# Patient Record
Sex: Female | Born: 1985 | Race: Black or African American | Hispanic: No | Marital: Single | Smoking: Never smoker
Health system: Southern US, Community
[De-identification: ages and names within clinical notes are randomized; demographics above are authoritative.]

## PROBLEM LIST (undated history)

## (undated) ENCOUNTER — Inpatient Hospital Stay (HOSPITAL_COMMUNITY): Payer: Self-pay

## (undated) ENCOUNTER — Inpatient Hospital Stay (HOSPITAL_COMMUNITY): Payer: 59

## (undated) DIAGNOSIS — R338 Other retention of urine: Secondary | ICD-10-CM

## (undated) DIAGNOSIS — D649 Anemia, unspecified: Secondary | ICD-10-CM

## (undated) DIAGNOSIS — Z9289 Personal history of other medical treatment: Secondary | ICD-10-CM

## (undated) DIAGNOSIS — O9903 Anemia complicating the puerperium: Secondary | ICD-10-CM

## (undated) HISTORY — PX: THERAPEUTIC ABORTION: SHX798

---
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
## 2007-09-29 ENCOUNTER — Emergency Department (HOSPITAL_COMMUNITY): Admission: EM | Admit: 2007-09-29 | Discharge: 2007-09-29 | Payer: Self-pay | Admitting: Emergency Medicine

## 2007-10-15 HISTORY — PX: LEEP: SHX91

## 2007-11-09 ENCOUNTER — Emergency Department (HOSPITAL_COMMUNITY): Admission: EM | Admit: 2007-11-09 | Discharge: 2007-11-10 | Payer: Self-pay | Admitting: *Deleted

## 2008-02-23 ENCOUNTER — Emergency Department (HOSPITAL_COMMUNITY): Admission: EM | Admit: 2008-02-23 | Discharge: 2008-02-23 | Payer: Self-pay | Admitting: Emergency Medicine

## 2008-08-04 ENCOUNTER — Encounter (INDEPENDENT_AMBULATORY_CARE_PROVIDER_SITE_OTHER): Payer: Self-pay | Admitting: Obstetrics and Gynecology

## 2008-08-04 ENCOUNTER — Ambulatory Visit (HOSPITAL_COMMUNITY): Admission: RE | Admit: 2008-08-04 | Discharge: 2008-08-04 | Payer: Self-pay | Admitting: Obstetrics and Gynecology

## 2009-07-18 ENCOUNTER — Emergency Department (HOSPITAL_COMMUNITY): Admission: EM | Admit: 2009-07-18 | Discharge: 2009-07-18 | Payer: Self-pay | Admitting: Family Medicine

## 2011-01-17 LAB — URINE CULTURE: Colony Count: 50000

## 2011-01-17 LAB — POCT URINALYSIS DIP (DEVICE)
Bilirubin Urine: NEGATIVE
Glucose, UA: NEGATIVE mg/dL
Hgb urine dipstick: NEGATIVE
Ketones, ur: NEGATIVE mg/dL
Nitrite: NEGATIVE
Protein, ur: NEGATIVE mg/dL
Specific Gravity, Urine: 1.02 (ref 1.005–1.030)
Urobilinogen, UA: 0.2 mg/dL (ref 0.0–1.0)
pH: 5 (ref 5.0–8.0)

## 2011-01-17 LAB — WET PREP, GENITAL: Trich, Wet Prep: NONE SEEN

## 2011-01-17 LAB — POCT PREGNANCY, URINE: Preg Test, Ur: NEGATIVE

## 2011-01-17 LAB — GC/CHLAMYDIA PROBE AMP, GENITAL
Chlamydia, DNA Probe: NEGATIVE
GC Probe Amp, Genital: NEGATIVE

## 2011-02-26 NOTE — Op Note (Signed)
Ashley Shaw, Ashley Shaw              ACCOUNT NO.:  [id number]   MEDICAL RECORD NO.:  [id number]          PATIENT TYPE:  AMB   LOCATION:  SDC                           FACILITY:  [facility]   PHYSICIAN:  Osborn Coho, M.D.   DATE OF BIRTH:  02-07-1986   DATE OF PROCEDURE:  08/04/2008  DATE OF DISCHARGE:                               OPERATIVE REPORT   PREOPERATIVE DIAGNOSIS:  Cervical intraepithelial neoplasia I and II.   POSTOPERATIVE DIAGNOSIS:  Cervical intraepithelial neoplasia I and II.   PROCEDURE:  Loop electrocautery excision procedure.   ATTENDING DOCTOR:  Osborn Coho, MD.   ANESTHESIA:  MAC.   SPECIMENS TO PATHOLOGY:  Portion of cervix with stitch at 12 o'clock and  loose piece at 6 o'clock.   FLUIDS:  500 mL.   URINE OUTPUT:  Quantity sufficient via straight cath prior to procedure.   ESTIMATED BLOOD LOSS:  Minimal.   COMPLICATIONS:  None.   DESCRIPTION OF PROCEDURE:  The patient was taken to the operating room  after the risks, benefits, and alternatives discussed with the patient.  The patient verbalized understanding and consent signed and witnessed.  The patient was given a MAC per anesthesia and prepped and draped in the  normal sterile fashion in the dorsal lithotomy position.  A bivalve  speculum was placed in the patient's vagina and the anterior lip of the  cervix was grasped with single-tooth tenaculum.  A paracervical block  was administered using a total of 10 mL of 1% lidocaine.  Acetic acid  was used to prep the cervix and colposcopy was performed.  1. Colposcopy.  2. LEEP.  Colposcopy was performed and with the appearance of abnormal vasculature  from the 3 to 9 o'clock position and acetowhite lesion at 12 o'clock.  The LEEP specimen was performed using a loop that measured 10 mm x 10  mm.  The bed of the cervix was cauterized with the ball cautery tip on  the Bovie.  Monsel was applied to the bed of the  cervix as well.  There was good hemostasis  noted.  The tenaculum was  removed and there was good hemostasis at tenaculum sites.  Count was  correct.  The patient tolerated the procedure well and was currently  awaiting transfer to the recovery room in good condition.      Osborn Coho, M.D.  Electronically Signed     AR/MEDQ  D:  08/04/2008  T:  08/04/2008  Job:  [id number]

## 2011-03-01 NOTE — Consult Note (Signed)
[place]. [facility]  Patient:    Ashley Shaw, Ashley Shaw Visit Number: [id number] MRN: [id number]          Service Type: PED Location: PEDS [phone] Attending Physician:  Pablo Ledger T Dictated by:   Ruthann Cancer, M.D. Proc. Date: 08/04/01 Admit Date:  08/04/2001   CC:         Hinton Rao, M.D.; fax [phone], phone [phone]   Consultation Report  PRIMARY CARE PHYSICIAN:  Hinton Rao, M.D. of [facility]  FINAL DIAGNOSES: 1. Anemia of unknown etiology (iron deficiency versus anemia of chronic    disease versus occult blood loss). 2. Abdominal pain of unknown etiology.  PROCEDURE: 1. Abdominal CT with contrast on August 06, 2001 showed no abnormalities, no    evidence of inflammation, and no lesion. 2. Chest x-ray on August 05, 2001 showed no infiltrates or hilar adenopathy. 3. Abdominal KUB on August 04, 2001:  No evidence of obstruction, otherwise    within normal limits.  CHIEF COMPLAINT:  Vertigo, blurred vision, anemia.  HISTORY OF PRESENT ILLNESS:  This is a 25 year old African-American female who was in her usual state of health until one month prior to admission when she had vertigo walking up steps to school.  She also had decreased hearing bilaterally at that time.  She went to class and put her head down on the desk and symptoms resolved in a few minutes.  Her next episode was a similar episode one week later when she was standing up from a sitting position.  Her symptoms recurred one week ago on rising from bed.  She went to her primary doctor last Monday and was given a diagnosis of sinusitis and sent home on antibiotics plus Antivert as needed.  A Pred-pack prescription was given, but not filled.  The patient felt better for a week, but then on October 21 she awoke with a headache.  On the 22nd she again had vertigo when going up the school steps and the school nurse measured her temperature to be 100.7.   At her primary doctor today her CBC showed a hemoglobin of 6.3 and hematocrit of 17.2.  She was given a dose of Ceftriaxone IM x 1, Phenergan IM x 1, and triamcinolone IM x 1.  She was then sent to [facility].  The patient syncopized on the way up to the floor from the emergency department.  The patient has menstrual bleeding approximately five days per month and goes through approximately three pads or tampons per day.  Her last menstrual period was July 10, 2001.  She had menarche on April 2001.  She has no change in urine color or stool color or consistency.  No bright red blood per rectum or melena.  PAST MEDICAL HISTORY:  Positive only for an admission at age 62 for three to four days for "fluid in her knee."  MEDICATIONS:  Only as above noted in HPI.  ALLERGIES:  NKDA.  SOCIAL HISTORY:  Denies alcohol, tobacco, drug use.  FAMILY HISTORY:  Negative for cancer, hemolytic disease except for one maternal great aunt who has "sickle cell."  PHYSICAL EXAMINATION  GENERAL:  The patient is pleasant, slightly tired, in no apparent distress.  VITAL SIGNS:  Stable tachycardia, normal pressures.  HEENT:  Normocephalic, atraumatic with pale sclerae and pale tongue and mucosa.  LUNGS:  Clear.  CARDIOVASCULAR:  She has normal S1, S2 and is tachycardic.  ABDOMEN:  Scaphoid, difficult to relax with some guarding.  No hepatosplenomegaly.  EXTREMITIES:  Pale palms and soles.  NEUROLOGIC:  Cranial nerves 2-12 are intact.  Strength is +4/5 in the right triceps, otherwise 5/5.  She has normal sensation.  LABORATORIES:  CBC:  On October 22 hemoglobin 5.4, hematocrit 15.6, white blood cell 8.7, platelets 308,000; on October 23 hemoglobin 8.9, hematocrit 25.8, white blood cell 7.4, platelets 254,000 (after transfusion); on October 24 hemoglobin 9.7, hematocrit 28.2, white blood cell 4.4, platelets 268,000. Iron studies:  Total iron 15, total iron binding capacity 204,  percent saturation 7%, transferrin 195, ferritin 104.  Haptoglobin 92, total bilirubin 1.0, direct bilirubin 0.1.  Coags:  PT 15.2, INR 1.3, PTT 32.  Chemistries: Sodium 134, potassium 3.6, chloride 105, CO2 23, glucose 133, BUN 6, creatinine 0.7, alkaline phosphatase 102, AST 38, ALT 14, total protein 8.1, albumin 3.4, calcium 8.6, LDH 298.  Thyroid studies:  Free T4 1.33, TSH 2.763. Beta hCG of urine negative.  Stool Hemoccult negative.  Erythropoietin 318.0 (normal range 2.6-34.0).  Urinalysis:  Specific gravity 1.009, pH 7.0, otherwise within normal limits.  Direct ______ negative.  EBV serologies are positive for IgG and negative for IgM indicating past infection.  Pending studies:  CMV serologies, human parvo virus, B19 serologies, and blood cultures.  ANA negative.  B12 633.  Folate is pending.  PLAN:  Admit the patient and check CBC, reticulocyte, type and cross.  Check total bilirubin, haptoglobin, and consider transfusion if indicated by the CBC.  HOSPITAL COURSE:  The patient was transfused 2 units of packed red blood cells on October 22 with appropriate response of her hemoglobin from 5.4 to 8.9 and hematocrit from 15.6 to 25.8.  Her hemoglobin and hematocrit on discharge were 9.7 and 28.2 respectively.  Her reticulocyte count was measured to be 0.9% indicating an apparent bone marrow suppression.  Iron studies showed greatly decreased total iron, decreased TIBC, greatly decreased percent saturation, and normal ferritin level.  This pattern is inconsistent with normal iron deficiency because total iron binding capacity would normally be elevated but it is also inconsistent with anemia of chronic disease because ferritin is normally elevated as well.  It was felt that the patient likely has a combined abnormality with both iron loss and anemia of chronic disease.  Her ESR was measured to be 134 and abdominal CT was ordered to evaluate for inflammatory bowel disease or other  evidence of lesion or inflammation given her abdominal  pain and guarding (patient denies pain except to deep palpation, but does not stop guarding).  The abdominal CT was normal.  Her chest x-ray showed no evidence of hilar adenopathy to suggest sarcoidosis or other infiltrate.  Her KUB showed no evidence of obstruction and she had heme-negative stool.  She had a normal haptoglobin and bilirubin levels and a normal peripheral smear indicating no hemolysis.  We also evaluated her for thyroid abnormalities and found her free T4 and TSH to be normal.  Direct ______ was also negative. Other studies included a negative ANA, normal B12, positive Igg for EBV, greatly elevated erythropoeitin, and pending serologies for CMV, parvo, B19, and fecal studies for ovum and parasites, white blood cells, and cultures. After a negative abdominal CT and continued stability of her vital signs (she was no longer tachycardic after her transfusion and showed no other evidence of syncope, dizziness, or any other complaints), the patient was discharged home to be followed up at [facility] as below.  She was prescribed adult multivitamin with iron.  INSTRUCTIONS: 1. Return  to [facility] to see Dr. Armandina Stammer on ______ at 2:20    p.m.  At that clinic visit she will have her PPD read.  This was placed on    October 23 at approximately 1 p.m.  She also needs to have an appointment    set up at the [facility] for further evaluation of her abdominal pain, elevated ESR, and    blood loss. 2. Activity as tolerated. 3. Regular diet. 4. Patient was told to return to the emergency room if she experiences    dizziness, fainting, rapid heart rate, bloody bowel movements, or any other    worrisome symptoms.  DISCHARGE MEDICATIONS:  Centrum multivitamin with iron one tablet p.o. q.d. Dictated by:   Ruthann Cancer, M.D. Attending Physician:   Pablo Ledger T DD:  08/06/01 TD:  08/06/01 Job: [id number]

## 2011-07-04 LAB — DIFFERENTIAL
Basophils Relative: 0
Eosinophils Absolute: 0
Eosinophils Relative: 1
Lymphs Abs: 0.5 — ABNORMAL LOW
Monocytes Absolute: 0.5
Monocytes Relative: 5
Neutrophils Relative %: 89 — ABNORMAL HIGH

## 2011-07-04 LAB — COMPREHENSIVE METABOLIC PANEL
ALT: 14
AST: 22
Albumin: 4.2
Alkaline Phosphatase: 72
GFR calc Af Amer: >60
Glucose, Bld: 98
Potassium: 3.7
Sodium: 137
Total Protein: 8.4 — ABNORMAL HIGH

## 2011-07-04 LAB — CBC
Hemoglobin: 12.5
RBC: 4.07
RDW: 14.8

## 2011-07-10 LAB — POCT URINALYSIS DIP (DEVICE)
Glucose, UA: NEGATIVE
Nitrite: NEGATIVE
Operator id: 235561
Protein, ur: NEGATIVE
Urobilinogen, UA: 1

## 2011-07-10 LAB — GC/CHLAMYDIA PROBE AMP, GENITAL
Chlamydia, DNA Probe: NEGATIVE
GC Probe Amp, Genital: NEGATIVE

## 2011-07-10 LAB — WET PREP, GENITAL

## 2011-07-16 LAB — HCG, SERUM, QUALITATIVE: Preg, Serum: NEGATIVE

## 2011-07-19 LAB — GC/CHLAMYDIA PROBE AMP, GENITAL: GC Probe Amp, Genital: NEGATIVE

## 2011-07-19 LAB — WET PREP, GENITAL: Trich, Wet Prep: NONE SEEN

## 2012-08-08 ENCOUNTER — Encounter (HOSPITAL_COMMUNITY): Payer: Self-pay | Admitting: Pharmacist

## 2012-08-11 ENCOUNTER — Other Ambulatory Visit: Payer: Self-pay | Admitting: Obstetrics and Gynecology

## 2012-08-12 ENCOUNTER — Encounter (HOSPITAL_COMMUNITY): Payer: Self-pay | Admitting: Anesthesiology

## 2012-08-12 NOTE — H&P (Signed)
Ashley Shaw, Ashley Shaw              ACCOUNT NO.:  [id number]  MEDICAL RECORD NO.:  [id number]  LOCATION:  PERIO                         FACILITY:  [facility]  PHYSICIAN:  Lenoard Aden, M.D.DATE OF BIRTH:  02-26-1986  DATE OF ADMISSION:  08/13/2012 DATE OF DISCHARGE:                             HISTORY & PHYSICAL   CHIEF COMPLAINT:  Elective TOP, 6 weeks.  HISTORY OF PRESENT ILLNESS:  She is a 26 year old African American female, G1, P0, at [redacted] weeks gestation confirmed by ultrasound for elective termination of pregnancy.  She has no known drug allergies.  Medications are none.  She has a personal history of a LEEP in 2009.  She is a nonsmoker and nondrinker.  She denies domestic or physical violence.  FAMILY HISTORY:  Noncontributory.  PHYSICAL EXAMINATION:  GENERAL:  She is a well-developed, well- nourished, African American female, in no acute distress. HEENT:  Normal. NECK:  Supple.  Full range of motion. LUNGS:  Clear. HEART:  Regular rhythm. ABDOMEN:  Soft, nontender. PELVIC:  Reveals a 6-8 weeks size uterus.  No adnexal masses.  IMPRESSION:  Six weeks intrauterine pregnancy for elective termination of pregnancy.  PLAN:  Proceed with suction D and E.  Risks of anesthesia, infection, bleeding, injury to abdominal organs, need for repair was discussed, delayed versus immediate complications include bowel and bladder injury noted.  The patient acknowledges and wishes to proceed.     Lenoard Aden, M.D.    RJT/MEDQ  D:  08/12/2012  T:  08/12/2012  Job:  [id number]

## 2012-08-13 ENCOUNTER — Encounter (HOSPITAL_COMMUNITY): Admission: RE | Payer: Self-pay | Source: Ambulatory Visit

## 2012-08-13 ENCOUNTER — Ambulatory Visit (HOSPITAL_COMMUNITY)
Admission: RE | Admit: 2012-08-13 | Payer: BC Managed Care – PPO | Source: Ambulatory Visit | Admitting: Obstetrics and Gynecology

## 2012-08-13 SURGERY — DILATION AND EVACUATION, UTERUS
Anesthesia: Choice

## 2012-08-15 ENCOUNTER — Emergency Department (HOSPITAL_COMMUNITY)
Admission: EM | Admit: 2012-08-15 | Discharge: 2012-08-16 | Disposition: A | Discharge status: Home / Self Care | Payer: BC Managed Care – PPO | Attending: Emergency Medicine | Admitting: Emergency Medicine

## 2012-08-15 ENCOUNTER — Encounter (HOSPITAL_COMMUNITY): Payer: Self-pay | Admitting: Family Medicine

## 2012-08-15 DIAGNOSIS — O200 Threatened abortion: Secondary | ICD-10-CM | POA: Insufficient documentation

## 2012-08-15 DIAGNOSIS — Z862 Personal history of diseases of the blood and blood-forming organs and certain disorders involving the immune mechanism: Secondary | ICD-10-CM | POA: Insufficient documentation

## 2012-08-15 HISTORY — DX: Anemia, unspecified: D64.9

## 2012-08-15 LAB — TYPE AND SCREEN: Antibody Screen: NEGATIVE

## 2012-08-15 LAB — HCG, QUANTITATIVE, PREGNANCY: hCG, Beta Chain, Quant, S: 43378 m[IU]/mL — ABNORMAL HIGH (ref <5)

## 2012-08-15 LAB — CBC WITH DIFFERENTIAL/PLATELET
Eosinophils Absolute: 0.1 10*3/uL (ref 0.0–0.7)
Eosinophils Relative: 1 % (ref 0–5)
Hemoglobin: 9.2 g/dL — ABNORMAL LOW (ref 12.0–15.0)
Lymphocytes Relative: 30 % (ref 12–46)
Lymphs Abs: 2.6 10*3/uL (ref 0.7–4.0)
MCH: 22.6 pg — ABNORMAL LOW (ref 26.0–34.0)
MCV: 74.7 fL — ABNORMAL LOW (ref 78.0–100.0)
Monocytes Relative: 10 % (ref 3–12)
Neutrophils Relative %: 59 % (ref 43–77)
Platelets: 235 10*3/uL (ref 150–400)
RBC: 4.07 MIL/uL (ref 3.87–5.11)
WBC: 8.6 10*3/uL (ref 4.0–10.5)

## 2012-08-15 LAB — POCT PREGNANCY, URINE: Preg Test, Ur: POSITIVE — AB

## 2012-08-15 NOTE — ED Notes (Signed)
Pt states understanding of discharge instructions 

## 2012-08-15 NOTE — ED Notes (Signed)
Talking with friends in lobby. No distress noted.

## 2012-08-15 NOTE — ED Provider Notes (Signed)
History     CSN: [id number]  Arrival date & time 08/15/12  4098   First MD Initiated Contact with Patient 08/15/12 2057      Chief Complaint  Patient presents with  . Vaginal Bleeding    (Consider location/radiation/quality/duration/timing/severity/associated sxs/prior treatment) HPI Comments: Patient found out she was pregnant last week. She started having bright red blood from her vagina 2 hours ago. She has not passed any tissue or clots. She states it is about like a period. She denies any pain. This is her first pregnancy.  Patient is a 26 y.o. female presenting with vaginal bleeding. The history is provided by the patient. No language interpreter was used.  Vaginal Bleeding This is a new problem. The current episode started today. The problem occurs constantly. The problem has been unchanged. Pertinent negatives include no abdominal pain, arthralgias, chest pain, chills, congestion, coughing, fatigue, fever, headaches, nausea, neck pain, vomiting or weakness. Nothing aggravates the symptoms. She has tried nothing for the symptoms. The treatment provided no relief.    Past Medical History  Diagnosis Date  . Anemia     History reviewed. No pertinent past surgical history.  No family history on file.  History  Substance Use Topics  . Smoking status: Not on file  . Smokeless tobacco: Not on file  . Alcohol Use:     OB History    Grav Para Term Preterm Abortions TAB SAB Ect Mult Living                  Review of Systems  Constitutional: Negative for fever, chills, activity change, appetite change and fatigue.  HENT: Negative for congestion, rhinorrhea, neck pain, neck stiffness and sinus pressure.   Eyes: Negative for discharge and visual disturbance.  Respiratory: Negative for cough, chest tightness, shortness of breath, wheezing and stridor.   Cardiovascular: Negative for chest pain and leg swelling.  Gastrointestinal: Negative for nausea, vomiting, abdominal  pain, diarrhea and abdominal distention.  Genitourinary: Positive for vaginal bleeding. Negative for decreased urine volume and difficulty urinating.  Musculoskeletal: Negative for back pain and arthralgias.  Skin: Negative for color change and pallor.  Neurological: Negative for weakness, light-headedness and headaches.  Psychiatric/Behavioral: Negative for behavioral problems and agitation.  All other systems reviewed and are negative.    Allergies  Review of patient's allergies indicates not on file.  Home Medications  No current outpatient prescriptions on file.  BP 123/81  Pulse 91  Temp 98.4 F (36.9 C) (Oral)  Resp 16  SpO2 100%  LMP 07/02/2012  Physical Exam  Nursing note and vitals reviewed. Constitutional: She is oriented to person, place, and time. She appears well-developed and well-nourished. No distress.  HENT:  Head: Normocephalic and atraumatic.  Mouth/Throat: No oropharyngeal exudate.  Eyes: EOM are normal. Pupils are equal, round, and reactive to light. Right eye exhibits no discharge. Left eye exhibits no discharge.  Neck: Normal range of motion. Neck supple. No JVD present.  Cardiovascular: Normal rate, regular rhythm and normal heart sounds.   Pulmonary/Chest: Effort normal and breath sounds normal. No stridor. No respiratory distress. She exhibits no tenderness.  Abdominal: Soft. Bowel sounds are normal. She exhibits no distension. There is no tenderness. There is no guarding.  Genitourinary: Vaginal discharge (scant dk blood. os closed) found.  Musculoskeletal: Normal range of motion. She exhibits no edema and no tenderness.  Neurological: She is alert and oriented to person, place, and time. No cranial nerve deficit. She exhibits normal muscle tone.  Skin: Skin is warm and dry. No rash noted. She is not diaphoretic.  Psychiatric: She has a normal mood and affect. Her behavior is normal. Judgment and thought content normal.    ED Course  Procedures  (including critical care time)  Labs Reviewed  CBC WITH DIFFERENTIAL - Abnormal; Notable for the following:    Hemoglobin 9.2 (*)     HCT 30.4 (*)     MCV 74.7 (*)     MCH 22.6 (*)     RDW 17.1 (*)     All other components within normal limits  POCT PREGNANCY, URINE - Abnormal; Notable for the following:    Preg Test, Ur POSITIVE (*)     All other components within normal limits  HCG, QUANTITATIVE, PREGNANCY - Abnormal; Notable for the following:    hCG, Beta Chain, Quant, [place] [address] (*)     All other components within normal limits  TYPE AND SCREEN  ABO/RH   No results found.   1. Threatened abortion in first trimester       MDM  Rh +, no rhogam indicated. Os closed, so threathened Ab. Not ectopic, saw yolk sac on transab [place], also had TVUS and pt states they told her the pregnancy was in the right place then. Has f/u monday Pt deemed stable for discharge. Return precautions were provided and pt expressed understanding to return to ED if any acute symptoms return. Follow up was instructed which pt also expressed understanding. All questions were answered and pt was in agreement w/ plan.         Warrick Parisian, MD 08/15/12 [phone]

## 2012-08-15 NOTE — ED Provider Notes (Signed)
I have personally seen and examined the patient.  I have discussed the plan of care with the resident.  I have reviewed the documentation on PMH/FH/Soc. History.  I have reviewed the documentation of the resident and agree.  Pt reports anemia chronic  Joya Gaskins, MD 08/15/12 2342

## 2012-08-15 NOTE — ED Notes (Signed)
Pelvic cart currently in use in department

## 2012-08-15 NOTE — ED Notes (Signed)
Pt. States she is  weeks pregnant confirmed by [facility]. States she started having a heavy flow of bright red blood. Denies cramping.

## 2012-08-15 NOTE — ED Notes (Signed)
Pt and pt mother concerned over wait, states they may just leave and see her doctor on Monday. Encouraged pt to stay for treatment.  Pt amb without problem. No resp distress. Pt states bleeding has stopped and she denies any pain.

## 2012-09-03 LAB — OB RESULTS CONSOLE ABO/RH

## 2012-09-03 LAB — OB RESULTS CONSOLE GC/CHLAMYDIA: Chlamydia: NEGATIVE

## 2012-09-03 LAB — OB RESULTS CONSOLE HEPATITIS B SURFACE ANTIGEN: Hepatitis B Surface Ag: NEGATIVE

## 2012-10-14 NOTE — L&D Delivery Note (Signed)
Operative Delivery Note At 8:21 PM a viable and healthy female was delivered via Vaginal, [employer], operational).  Presentation: vertex; Position: Left,, Occiput,, Anterior; Station: +4.  Verbal consent: obtained from patient.  Risks and benefits discussed in detail.  Risks include, but are not limited to the risks of anesthesia, bleeding, infection, damage to maternal tissues, fetal cephalhematoma.  There is also the risk of inability to effect vaginal delivery of the head, or shoulder dystocia that cannot be resolved by established maneuvers, leading to the need for emergency cesarean section.  APGAR: 8, 9; weight 9 lb 1.5 oz (4125 g).   Placenta status: Intact, Spontaneous.   Cord: 3 vessels with the following complications: None.  Cord pH: na  Anesthesia: Epidural  Instruments: Kiwi x one pull Episiotomy: None Lacerations: 2nd degree;Perineal Suture Repair: 2.0 3.0 vicryl vicryl rapide abd ) vicryl Est. Blood Loss (mL): 400  Mom to postpartum.  Baby to nursery-stable Vagina packed after delivery due to oozing perineal outlet on right. Will remove on transfer to floor.Lenoard Aden 04/12/2013, 9:43 PM

## 2013-03-30 ENCOUNTER — Inpatient Hospital Stay (HOSPITAL_COMMUNITY): Admission: AD | Admit: 2013-03-30 | Payer: Self-pay | Source: Ambulatory Visit | Admitting: Obstetrics and Gynecology

## 2013-04-06 ENCOUNTER — Other Ambulatory Visit: Payer: Self-pay | Admitting: Obstetrics and Gynecology

## 2013-04-11 ENCOUNTER — Inpatient Hospital Stay (HOSPITAL_COMMUNITY)
Admission: RE | Admit: 2013-04-11 | Discharge: 2013-04-14 | DRG: 373 | Disposition: A | Discharge status: Home / Self Care | Payer: BC Managed Care – PPO | Source: Ambulatory Visit | Attending: Obstetrics & Gynecology | Admitting: Obstetrics & Gynecology

## 2013-04-11 ENCOUNTER — Encounter (HOSPITAL_COMMUNITY): Payer: Self-pay

## 2013-04-11 DIAGNOSIS — Z22330 Carrier of Group B streptococcus: Secondary | ICD-10-CM

## 2013-04-11 DIAGNOSIS — D696 Thrombocytopenia, unspecified: Secondary | ICD-10-CM | POA: Diagnosis present

## 2013-04-11 DIAGNOSIS — R338 Other retention of urine: Secondary | ICD-10-CM | POA: Diagnosis not present

## 2013-04-11 DIAGNOSIS — O409XX0 Polyhydramnios, unspecified trimester, not applicable or unspecified: Secondary | ICD-10-CM | POA: Diagnosis present

## 2013-04-11 DIAGNOSIS — O99892 Other specified diseases and conditions complicating childbirth: Secondary | ICD-10-CM | POA: Diagnosis present

## 2013-04-11 DIAGNOSIS — O9912 Other diseases of the blood and blood-forming organs and certain disorders involving the immune mechanism complicating childbirth: Secondary | ICD-10-CM | POA: Diagnosis present

## 2013-04-11 DIAGNOSIS — O480 Post-term pregnancy: Principal | ICD-10-CM | POA: Diagnosis present

## 2013-04-11 DIAGNOSIS — D649 Anemia, unspecified: Secondary | ICD-10-CM | POA: Diagnosis present

## 2013-04-11 DIAGNOSIS — O9903 Anemia complicating the puerperium: Secondary | ICD-10-CM | POA: Diagnosis present

## 2013-04-11 DIAGNOSIS — O9902 Anemia complicating childbirth: Secondary | ICD-10-CM | POA: Diagnosis present

## 2013-04-11 DIAGNOSIS — D689 Coagulation defect, unspecified: Secondary | ICD-10-CM | POA: Diagnosis present

## 2013-04-11 HISTORY — DX: Personal history of other medical treatment: Z92.89

## 2013-04-11 HISTORY — DX: Anemia complicating the puerperium: O99.03

## 2013-04-11 HISTORY — DX: Other retention of urine: R33.8

## 2013-04-11 LAB — CBC
HCT: 28.6 % — ABNORMAL LOW (ref 36.0–46.0)
Hemoglobin: 9.2 g/dL — ABNORMAL LOW (ref 12.0–15.0)
MCH: 25.9 pg — ABNORMAL LOW (ref 26.0–34.0)
MCHC: 32.2 g/dL (ref 30.0–36.0)
MCV: 80.6 fL (ref 78.0–100.0)

## 2013-04-11 MED ORDER — IBUPROFEN 600 MG PO TABS: 600.0000 mg | ORAL_TABLET | Freq: Four times a day (QID) | ORAL | Status: DC | PRN

## 2013-04-11 MED ORDER — ZOLPIDEM TARTRATE 5 MG PO TABS: 5.0000 mg | ORAL_TABLET | Freq: Every evening | ORAL | Status: DC | PRN

## 2013-04-11 MED ORDER — OXYTOCIN 40 UNITS IN LACTATED RINGERS INFUSION - SIMPLE MED
1.0000 m[IU]/min | INTRAVENOUS | Status: DC
  Administered 2013-04-12: 2 m[IU]/min via INTRAVENOUS
  Filled 2013-04-11: qty 1000

## 2013-04-11 MED ORDER — LIDOCAINE HCL (PF) 1 % IJ SOLN
30.0000 mL | INTRAMUSCULAR | Status: DC | PRN
  Administered 2013-04-12: 30 mL via SUBCUTANEOUS
  Filled 2013-04-11 (×2): qty 30

## 2013-04-11 MED ORDER — ACETAMINOPHEN 325 MG PO TABS
650.0000 mg | ORAL_TABLET | ORAL | Status: DC | PRN
  Administered 2013-04-11 – 2013-04-12 (×3): 650 mg via ORAL
  Filled 2013-04-11 (×3): qty 2

## 2013-04-11 MED ORDER — ONDANSETRON HCL 4 MG/2ML IJ SOLN
4.0000 mg | Freq: Four times a day (QID) | INTRAMUSCULAR | Status: DC | PRN
  Administered 2013-04-12: 4 mg via INTRAVENOUS
  Filled 2013-04-11: qty 2

## 2013-04-11 MED ORDER — BUTORPHANOL TARTRATE 1 MG/ML IJ SOLN
1.0000 mg | INTRAMUSCULAR | Status: DC | PRN
  Administered 2013-04-12: 1 mg via INTRAVENOUS
  Filled 2013-04-11: qty 1

## 2013-04-11 MED ORDER — FLEET ENEMA 7-19 GM/118ML RE ENEM: 1.0000 | ENEMA | RECTAL | Status: DC | PRN

## 2013-04-11 MED ORDER — MISOPROSTOL 25 MCG QUARTER TABLET
25.0000 ug | ORAL_TABLET | ORAL | Status: DC | PRN
  Administered 2013-04-11 – 2013-04-12 (×2): 25 ug via VAGINAL
  Filled 2013-04-11: qty 1
  Filled 2013-04-11 (×2): qty 0.25

## 2013-04-11 MED ORDER — PENICILLIN G POTASSIUM 5000000 UNITS IJ SOLR: 2.5000 10*6.[IU] | INTRAVENOUS | Status: DC

## 2013-04-11 MED ORDER — OXYTOCIN 40 UNITS IN LACTATED RINGERS INFUSION - SIMPLE MED
62.5000 mL/h | INTRAVENOUS | Status: DC
  Administered 2013-04-12: 62.5 mL/h via INTRAVENOUS

## 2013-04-11 MED ORDER — TERBUTALINE SULFATE 1 MG/ML IJ SOLN: 0.2500 mg | Freq: Once | INTRAMUSCULAR | Status: AC | PRN

## 2013-04-11 MED ORDER — PENICILLIN G POTASSIUM 5000000 UNITS IJ SOLR: 5.0000 10*6.[IU] | Freq: Once | INTRAVENOUS | Status: DC

## 2013-04-11 MED ORDER — LACTATED RINGERS IV SOLN
INTRAVENOUS | Status: DC
  Administered 2013-04-11 – 2013-04-12 (×2): via INTRAVENOUS

## 2013-04-11 MED ORDER — LACTATED RINGERS IV SOLN: 500.0000 mL | INTRAVENOUS | Status: DC | PRN

## 2013-04-11 MED ORDER — OXYCODONE-ACETAMINOPHEN 5-325 MG PO TABS
1.0000 | ORAL_TABLET | ORAL | Status: DC | PRN
  Administered 2013-04-12: 2 via ORAL
  Filled 2013-04-11: qty 2

## 2013-04-11 MED ORDER — CITRIC ACID-SODIUM CITRATE 334-500 MG/5ML PO SOLN: 30.0000 mL | ORAL | Status: DC | PRN

## 2013-04-11 MED ORDER — OXYTOCIN BOLUS FROM INFUSION
500.0000 mL | INTRAVENOUS | Status: DC
  Administered 2013-04-12: 500 mL via INTRAVENOUS

## 2013-04-11 NOTE — Progress Notes (Signed)
Ashley Shaw is a 27 y.o. No obstetric history on file. at Unknown by LMP admitted for induction of labor due to Postdates.  Subjective: Comfortable  Objective: BP 133/79  Pulse 87  Temp(Src) 98.6 F (37 C) (Oral)  Resp 20  Ht 5\' 9"  (1.753 m)  Wt 82.555 kg (182 lb)  BMI 26.86 kg/m2  LMP 07/02/2012     FHT:  FHR: 145 bpm, variability: moderate,  accelerations:  Present,  decelerations:  Absent and   UC:   none SVE:    1/70/-1  Labs: CBC pending   Assessment / Plan: 41 weeks Postdates induction for cytotec GBS positive Gestational Thrombocytopenia  Labor: Progressing normally Preeclampsia:  labs stable Fetal Wellbeing:  Category I Pain Control:  Labor support without medications I/D:  n/a Anticipated MOD:  NSVD Start Abx with labor Start Pitocin in am  Ashley Shaw 04/11/2013, 8:12 PM

## 2013-04-12 ENCOUNTER — Encounter (HOSPITAL_COMMUNITY): Payer: Self-pay

## 2013-04-12 ENCOUNTER — Inpatient Hospital Stay (HOSPITAL_COMMUNITY): Payer: BC Managed Care – PPO | Admitting: Anesthesiology

## 2013-04-12 ENCOUNTER — Encounter (HOSPITAL_COMMUNITY): Payer: Self-pay | Admitting: Anesthesiology

## 2013-04-12 DIAGNOSIS — O480 Post-term pregnancy: Secondary | ICD-10-CM | POA: Diagnosis present

## 2013-04-12 LAB — CBC
HCT: 29.6 % — ABNORMAL LOW (ref 36.0–46.0)
Hemoglobin: 9.5 g/dL — ABNORMAL LOW (ref 12.0–15.0)
MCH: 26 pg (ref 26.0–34.0)
MCH: 26.3 pg (ref 26.0–34.0)
MCHC: 32.1 g/dL (ref 30.0–36.0)
MCHC: 31.7 g/dL (ref 30.0–36.0)
MCV: 81.1 fL (ref 78.0–100.0)
MCV: 83 fL (ref 78.0–100.0)
Platelets: 132 10*3/uL — ABNORMAL LOW (ref 150–400)
RDW: 17.3 % — ABNORMAL HIGH (ref 11.5–15.5)

## 2013-04-12 MED ORDER — FENTANYL 2.5 MCG/ML BUPIVACAINE 1/10 % EPIDURAL INFUSION (WH - ANES)
14.0000 mL/h | INTRAMUSCULAR | Status: DC | PRN
  Administered 2013-04-12 (×2): 14 mL/h via EPIDURAL
  Filled 2013-04-12 (×2): qty 125

## 2013-04-12 MED ORDER — SODIUM BICARBONATE 8.4 % IV SOLN
INTRAVENOUS | Status: DC | PRN
  Administered 2013-04-12: 5 mL via EPIDURAL

## 2013-04-12 MED ORDER — LACTATED RINGERS IV SOLN
500.0000 mL | Freq: Once | INTRAVENOUS | Status: AC
  Administered 2013-04-12: 500 mL via INTRAVENOUS

## 2013-04-12 MED ORDER — DIPHENHYDRAMINE HCL 25 MG PO CAPS: 25.0000 mg | ORAL_CAPSULE | Freq: Four times a day (QID) | ORAL | Status: DC | PRN

## 2013-04-12 MED ORDER — ONDANSETRON HCL 4 MG PO TABS: 4.0000 mg | ORAL_TABLET | ORAL | Status: DC | PRN

## 2013-04-12 MED ORDER — EPHEDRINE 5 MG/ML INJ
10.0000 mg | INTRAVENOUS | Status: DC | PRN
  Filled 2013-04-12: qty 2
  Filled 2013-04-12: qty 4

## 2013-04-12 MED ORDER — METHYLERGONOVINE MALEATE 0.2 MG PO TABS: 0.2000 mg | ORAL_TABLET | ORAL | Status: DC | PRN

## 2013-04-12 MED ORDER — METHYLERGONOVINE MALEATE 0.2 MG/ML IJ SOLN: 0.2000 mg | INTRAMUSCULAR | Status: DC | PRN

## 2013-04-12 MED ORDER — WITCH HAZEL-GLYCERIN EX PADS: 1.0000 "application " | MEDICATED_PAD | CUTANEOUS | Status: DC | PRN

## 2013-04-12 MED ORDER — ZOLPIDEM TARTRATE 5 MG PO TABS: 5.0000 mg | ORAL_TABLET | Freq: Every evening | ORAL | Status: DC | PRN

## 2013-04-12 MED ORDER — DIBUCAINE 1 % RE OINT: 1.0000 "application " | TOPICAL_OINTMENT | RECTAL | Status: DC | PRN

## 2013-04-12 MED ORDER — PHENYLEPHRINE 40 MCG/ML (10ML) SYRINGE FOR IV PUSH (FOR BLOOD PRESSURE SUPPORT)
80.0000 ug | PREFILLED_SYRINGE | INTRAVENOUS | Status: DC | PRN
  Filled 2013-04-12: qty 2
  Filled 2013-04-12: qty 5

## 2013-04-12 MED ORDER — SIMETHICONE 80 MG PO CHEW: 80.0000 mg | CHEWABLE_TABLET | ORAL | Status: DC | PRN

## 2013-04-12 MED ORDER — EPHEDRINE 5 MG/ML INJ
10.0000 mg | INTRAVENOUS | Status: DC | PRN
  Filled 2013-04-12: qty 2

## 2013-04-12 MED ORDER — PHENYLEPHRINE 40 MCG/ML (10ML) SYRINGE FOR IV PUSH (FOR BLOOD PRESSURE SUPPORT)
80.0000 ug | PREFILLED_SYRINGE | INTRAVENOUS | Status: DC | PRN
  Filled 2013-04-12: qty 2

## 2013-04-12 MED ORDER — BENZOCAINE-MENTHOL 20-0.5 % EX AERO
1.0000 "application " | INHALATION_SPRAY | CUTANEOUS | Status: DC | PRN
  Administered 2013-04-13 – 2013-04-14 (×2): 1 via TOPICAL
  Filled 2013-04-12 (×2): qty 56

## 2013-04-12 MED ORDER — OXYCODONE-ACETAMINOPHEN 5-325 MG PO TABS
1.0000 | ORAL_TABLET | ORAL | Status: DC | PRN
  Administered 2013-04-13 (×3): 2 via ORAL
  Administered 2013-04-13 – 2013-04-14 (×2): 1 via ORAL
  Filled 2013-04-12 (×2): qty 2
  Filled 2013-04-12 (×2): qty 1
  Filled 2013-04-12: qty 2

## 2013-04-12 MED ORDER — DEXTROSE 5 % IV SOLN
5.0000 10*6.[IU] | Freq: Once | INTRAVENOUS | Status: AC
  Administered 2013-04-12: 5 10*6.[IU] via INTRAVENOUS
  Filled 2013-04-12: qty 5

## 2013-04-12 MED ORDER — DIPHENHYDRAMINE HCL 50 MG/ML IJ SOLN: 12.5000 mg | INTRAMUSCULAR | Status: DC | PRN

## 2013-04-12 MED ORDER — IBUPROFEN 600 MG PO TABS
600.0000 mg | ORAL_TABLET | Freq: Four times a day (QID) | ORAL | Status: DC
  Administered 2013-04-12 – 2013-04-14 (×6): 600 mg via ORAL
  Filled 2013-04-12 (×6): qty 1

## 2013-04-12 MED ORDER — PENICILLIN G POTASSIUM 5000000 UNITS IJ SOLR
2.5000 10*6.[IU] | INTRAVENOUS | Status: DC
  Administered 2013-04-12 (×3): 2.5 10*6.[IU] via INTRAVENOUS
  Filled 2013-04-12 (×5): qty 2.5

## 2013-04-12 MED ORDER — ONDANSETRON HCL 4 MG/2ML IJ SOLN: 4.0000 mg | INTRAMUSCULAR | Status: DC | PRN

## 2013-04-12 MED ORDER — SENNOSIDES-DOCUSATE SODIUM 8.6-50 MG PO TABS
2.0000 | ORAL_TABLET | Freq: Every day | ORAL | Status: DC
  Administered 2013-04-13: 2 via ORAL

## 2013-04-12 MED ORDER — TETANUS-DIPHTH-ACELL PERTUSSIS 5-2.5-18.5 LF-MCG/0.5 IM SUSP: 0.5000 mL | Freq: Once | INTRAMUSCULAR | Status: DC

## 2013-04-12 MED ORDER — LANOLIN HYDROUS EX OINT: TOPICAL_OINTMENT | CUTANEOUS | Status: DC | PRN

## 2013-04-12 MED ORDER — PRENATAL MULTIVITAMIN CH
1.0000 | ORAL_TABLET | Freq: Every day | ORAL | Status: DC
  Administered 2013-04-13: 1 via ORAL
  Filled 2013-04-12: qty 1

## 2013-04-12 NOTE — Progress Notes (Signed)
Dr Billy Coast notified of pushing progress, station, and pain level on pt perineum. At bedside assessing pushing quality.

## 2013-04-12 NOTE — Anesthesia Preprocedure Evaluation (Signed)
Anesthesia Evaluation  Patient identified by MRN, date of birth, ID band Patient awake    Reviewed: Allergy & Precautions, H&P , Patient's Chart, lab work & pertinent test results  Airway Mallampati: II  TM Distance: >3 FB Neck ROM: full    Dental  (+) Teeth Intact   Pulmonary  breath sounds clear to auscultation        Cardiovascular Rhythm:regular Rate:Normal     Neuro/Psych    GI/Hepatic   Endo/Other    Renal/GU      Musculoskeletal   Abdominal   Peds  Hematology  (+) Sickle cell trait ,   Anesthesia Other Findings       Reproductive/Obstetrics (+) Pregnancy                            Anesthesia Physical Anesthesia Plan  ASA: II  Anesthesia Plan: Epidural   Post-op Pain Management:    Induction:   Airway Management Planned:   Additional Equipment:   Intra-op Plan:   Post-operative Plan:   Informed Consent: I have reviewed the patients History and Physical, chart, labs and discussed the procedure including the risks, benefits and alternatives for the proposed anesthesia with the patient or authorized representative who has indicated his/her understanding and acceptance.   Dental Advisory Given  Plan Discussed with:   Anesthesia Plan Comments: (Labs checked- platelets confirmed with RN in room. Fetal heart tracing, per RN, reported to be stable enough for sitting procedure. Discussed epidural, and patient consents to the procedure:  included risk of possible headache,backache, failed block, allergic reaction, and nerve injury. This patient was asked if she had any questions or concerns before the procedure started.)        Anesthesia Quick Evaluation  

## 2013-04-12 NOTE — Progress Notes (Signed)
Ashley Shaw is a 27 y.o. G1P0 at [redacted]w[redacted]d by LMP admitted for induction of labor due to postdates.  Subjective: Feels pressure Pushing ineffectively  Objective: BP 121/103  Pulse 89  Temp(Src) 97.9 F (36.6 C) (Oral)  Resp 16  Ht 5\' 9"  (1.753 m)  Wt 82.555 kg (182 lb)  BMI 26.86 kg/m2  SpO2 97%  LMP 07/02/2012      FHT:  FHR: 155 bpm, variability: moderate,  accelerations:  Present,  decelerations:  Absent UC:   regular, every 2 minutes SVE:   Dilation: Lip/rim Effacement (%): 100 Station: 0;-1 Exam by:: m wilkins rnc  Labs: Lab Results  Component Value Date   WBC 10.5 04/12/2013   HGB 9.5* 04/12/2013   HCT 29.6* 04/12/2013   MCV 81.1 04/12/2013   PLT 109* 04/12/2013    Assessment / Plan: Induction of labor due to postterm,  progressing well on pitocin Inadequate maternal expulsive efforts x one hour  Labor: Progressing normally- will continue pushing Preeclampsia:  labs stable Fetal Wellbeing:  Category I Pain Control:  Epidural I/D:  n/a Anticipated MOD:  NSVD  Rein Popov J 04/12/2013, 7:11 PM

## 2013-04-12 NOTE — Anesthesia Procedure Notes (Signed)

## 2013-04-12 NOTE — Progress Notes (Signed)
Ashley Shaw is a 27 y.o. G1P0 at [redacted]w[redacted]d by LMP admitted for postdates.  Subjective: Comfortable with Epidural  Objective: BP 127/80  Pulse 76  Temp(Src) 98.6 F (37 C) (Oral)  Resp 18  Ht 5\' 9"  (1.753 m)  Wt 82.555 kg (182 lb)  BMI 26.86 kg/m2  SpO2 97%  LMP 07/02/2012      FHT:  FHR: 145 bpm, variability: moderate,  accelerations:  Present,  decelerations:  Absent UC:   regular, every 2 minutes SVE:   Dilation: 5.5 Effacement (%): 80 Station: -2 Exam by:: L. Mcdaniel Rn  Labs: Lab Results  Component Value Date   WBC 10.5 04/12/2013   HGB 9.5* 04/12/2013   HCT 29.6* 04/12/2013   MCV 81.1 04/12/2013   PLT 109* 04/12/2013    Assessment / Plan: Induction of labor due to postterm,  progressing well on pitocin  Labor: Progressing normally Preeclampsia:  labs stable Fetal Wellbeing:  Category I Pain Control:  Epidural I/D:  n/a Anticipated MOD:  NSVD  Ashley Shaw 04/12/2013, 1:26 PM

## 2013-04-12 NOTE — H&P (Signed)
Ashley Shaw, POEHLER              ACCOUNT NO.:  [id number]  MEDICAL RECORD NO.:  [id number]  LOCATION:  9167                          FACILITY:  [facility]  PHYSICIAN:  Lenoard Aden, M.D.DATE OF BIRTH:  1986/03/22  DATE OF ADMISSION:  04/11/2013 DATE OF DISCHARGE:                             HISTORY & PHYSICAL   CHIEF COMPLAINT:  Postdates induction.  She is a 27 year old African-American female, G1, P0, at [redacted] weeks gestation, who presents for cervical ripening and induction due to postdate status.  Her prenatal course was complicated by history of thrombocytopenia, history of polyhydramnios, and history of a LEEP with normal cervical length.  Her most recent platelet count was 112,000. She is a nonsmoker, nondrinker.  She denies domestic or physical violence.  She has a personal history of a LEEP for abnormal Pap smear and questionable history of decreased platelet count.  She has a family history of colon cancer.  This is her first pregnancy.  PHYSICAL EXAMINATION:  GENERAL:  She is a well-developed, well- nourished, African-American female, in no acute distress. HEENT:  Normal. NECK:  Supple.  Full range of motion. LUNGS:  Clear. HEART:  Regular rate and rhythm. ABDOMEN:  Soft, gravid, nontender. PELVIC:  __________ vertex, -1. EXTREMITIES:  There are no cords. NEUROLOGIC:  Nonfocal. SKIN:  Intact.  IMPRESSION:  A 41-week intrauterine pregnancy for cervical ripening and induction.  NST reactive.  Cytotec placed.  PLAN:  Cytotec this evening, Pitocin in a.m., epidural as needed. Anticipate attempts at vaginal delivery.     Lenoard Aden, M.D.     RJT/MEDQ  D:  04/11/2013  T:  04/11/2013  Job:  [id number]

## 2013-04-12 NOTE — Progress Notes (Signed)
Ashley Shaw is a 27 y.o. G1P0 at [redacted]w[redacted]d by LMP admitted for induction of labor due to postadates.  Subjective: Feels contractions  Objective: BP 134/65  Pulse 83  Temp(Src) 98.6 F (37 C) (Oral)  Resp 18  Ht 5\' 9"  (1.753 m)  Wt 82.555 kg (182 lb)  BMI 26.86 kg/m2  SpO2 97%  LMP 07/02/2012       FHT:  FHR: 145] bpm, variability: moderate,  accelerations:  Present,  decelerations:  Absent UC:   regular, every 3 minutes SVE:   2-3/90/0 AROM- clear  Labs: Lab Results  Component Value Date   WBC 12.1* 04/11/2013   HGB 9.2* 04/11/2013   HCT 28.6* 04/11/2013   MCV 80.6 04/11/2013   PLT 121* 04/11/2013    Assessment / Plan: Induction of labor due to postterm,  progressing well on pitocin GBS positive Gestational Thrombocytopenia  Labor: latent phase Preeclampsia:  labs stable Fetal Wellbeing:  Category I Pain Control:  Labor support without medications, considering Epidural I/D:  n/a Anticipated MOD:  NSVD  Megin Consalvo J 04/12/2013, 6:52 AM

## 2013-04-13 ENCOUNTER — Encounter (HOSPITAL_COMMUNITY): Payer: Self-pay

## 2013-04-13 DIAGNOSIS — O9903 Anemia complicating the puerperium: Secondary | ICD-10-CM

## 2013-04-13 DIAGNOSIS — R338 Other retention of urine: Secondary | ICD-10-CM

## 2013-04-13 HISTORY — DX: Anemia complicating the puerperium: O99.03

## 2013-04-13 HISTORY — DX: Other retention of urine: R33.8

## 2013-04-13 LAB — CBC
Platelets: 124 10*3/uL — ABNORMAL LOW (ref 150–400)
RBC: 2.9 MIL/uL — ABNORMAL LOW (ref 3.87–5.11)
RDW: 17.5 % — ABNORMAL HIGH (ref 11.5–15.5)
WBC: 16.4 10*3/uL — ABNORMAL HIGH (ref 4.0–10.5)

## 2013-04-13 MED ORDER — LIDOCAINE HCL 2 % EX GEL
Freq: Once | CUTANEOUS | Status: AC
  Administered 2013-04-13: 5 via URETHRAL
  Filled 2013-04-13 (×2): qty 5

## 2013-04-13 NOTE — Progress Notes (Signed)
Patient ID: Ashley Shaw, female   DOB: 1986/01/27, 27 y.o.   MRN: [id number] PPD #1 SVD  S:  Reports feeling well - really sleepy             Tolerating po/ No nausea or vomiting             Bleeding is moderate             Pain controlled with ibuprofen (OTC) and lidocaine jelly             Up ad lib / ambulatory / (+) voiding - urinary retention throughout the night until this AM    Newborn  Information for the patient's newborn:  Kyleah, Pensabene Hadiyah [[id number]]  female  breast feeding  / Circumcision yes   O:  A & O x 3 , no acute distress             VS:  Filed Vitals:   04/12/13 2252 04/12/13 2315 04/13/13 0030 04/13/13 0515  BP: 123/79 128/90 119/59 104/66  Pulse: 91 95 96 80  Temp:  99.5 F (37.5 C) 99.1 F (37.3 C) 97.8 F (36.6 C)  TempSrc:  Oral Oral Oral  Resp: 18 22 18 18   Height:      Weight:      SpO2:        LABS:  Recent Labs  04/12/13 2205 04/13/13 0605  WBC 18.3* 16.4*  HGB 8.5* 7.5*  HCT 26.8* 23.4*  PLT 132* 124*    Blood type: O POS (06/29 2020)  Rubella: Immune (11/21 0000)     Lungs: Clear and unlabored  Heart: regular rate and rhythm / no murmurs  Abdomen: soft, non-tender, non-distended              Fundus: firm, non-tender, @ U midline, no displacement  Perineum: marked edematous - ice pack   Lochia: moderate  Extremities: no edema, no calf pain or tenderness, no Homans    A/P: PPD # 1  27 y.o., G1P1001   Postpartum, S/P vaginal birth  Anemia in pregnancy, postpartum  Urinary retention, resolved   Doing well - stable status  Routine post partum orders  Avoid bladder distension  Urinate every 2-3 hours  Continue ice packs to perineum  Anticipate discharge tomorrow  Raelyn Mora, M, MSN, CNM 04/13/2013, 11:56 AM

## 2013-04-13 NOTE — Progress Notes (Signed)
I was called to [facility] to assist in placing a foley catheter on this patient following 2 attempts by MB RN's.  Patient perineum, vagina, labia all extremely edematous.  Patient having difficulty keeping knees up and legs apart.  I attempted x 2 to place the foley catheter and was unable.  I was unable to visualize the urethra.  Dr. Billy Coast notified.

## 2013-04-13 NOTE — Anesthesia Postprocedure Evaluation (Signed)
  Anesthesia Post-op Note  Patient: Ashley Shaw  Procedure(s) Performed: * No procedures listed *  Patient Location: Mother/Baby  Anesthesia Type:Epidural  Level of Consciousness: awake  Airway and Oxygen Therapy: Patient Spontanous Breathing  Post-op Pain: none  Post-op Assessment: Patient's Cardiovascular Status Stable, Respiratory Function Stable, Patent Airway, No signs of Nausea or vomiting, Adequate PO intake, Pain level controlled, No headache, No backache, No residual numbness and No residual motor weakness  Post-op Vital Signs: Reviewed and stable  Complications: No apparent anesthesia complications

## 2013-04-13 NOTE — Progress Notes (Signed)
Patient extremely edematous, but tissue is soft.  Foley catheter insertion attempted x2 without success.  L/D called to assist.

## 2013-04-13 NOTE — Progress Notes (Signed)
Patient i/o cathed for 500 by l/d RN at 2250.  Attempted to void at 0030 and 0500 without success.  Insert foley catheter for 12 hours per Dr. Billy Coast.

## 2013-04-14 MED ORDER — IBUPROFEN 600 MG PO TABS: 600.0000 mg | ORAL_TABLET | Freq: Four times a day (QID) | ORAL | Status: DC

## 2013-04-14 MED ORDER — POLYSACCHARIDE IRON COMPLEX 150 MG PO CAPS: 150.0000 mg | ORAL_CAPSULE | Freq: Two times a day (BID) | ORAL | Status: DC

## 2013-04-14 NOTE — Discharge Summary (Signed)
Reviewed and agree with note and plan. V.Evonna Stoltz, MD  

## 2013-04-14 NOTE — Progress Notes (Signed)
Patient ID: Ashley Shaw, female   DOB: [birth year], 27 y.o.   MRN: [id number] Post Partum Day #2            Information for the patient's newborn:  Ashley Shaw, Ashley Shaw [[id number]]  female   / circumcision done Feeding: breast  Subjective: No HA, SOB, CP, F/C, breast symptoms. Pain managed well with Ibuprofen. Normal vaginal bleeding, no clots.      Objective:  Temp:  [97.4 F (36.3 C)-98.6 F (37 C)] 98.6 F (37 C) (07/02 0820) Pulse Rate:  [83-96] 83 (07/02 0820) Resp:  [18-20] 20 (07/02 0820) BP: (111-127)/(17-80) 118/76 mmHg (07/02 0820) SpO2:  [97 %-100 %] 97 % (07/02 0820)  No intake or output data in the 24 hours ending 04/14/13 0838     Recent Labs  04/12/13 2205 04/13/13 0605  WBC 18.3* 16.4*  HGB 8.5* 7.5*  HCT 26.8* 23.4*  PLT 132* 124*    Blood type: O POS (06/29 2020) Rubella: Immune (11/21 0000)    Physical Exam:  General: alert, fatigued and no distress Uterine Fundus: firm Lochia: appropriate Perineum: (+) edema - ice pack in place - improving, soft and non-tender to palpation DVT Evaluation: No evidence of DVT seen on physical exam. Negative Homan's sign. No cords or calf tenderness. No significant calf/ankle edema.    Assessment/Plan: PPD # 2 / 27 y.o., G1P1001 S/P: spontaneous vaginal  Postpartum care following vaginal delivery (6/30)    Anemia of mother in pregnancy, delivered with postpartum condition    Acute urinary retention - resolved     Normal postpartum exam  Continue current postpartum care  Continue with ice packs to perineum until swelling resolved  D/C home   LOS: 3 days   Ashley Shaw, M, MSN, CNM 04/14/2013, 8:38 AM

## 2013-04-14 NOTE — Discharge Summary (Signed)
OBSTETRICAL DISCHARGE SUMMARY   Patient ID: ZARIANA STRUB MRN: [id number] DOB/AGE: Nov 19, 1985 27 y.o.  Admit date: 04/11/2013 Discharge date: 04/14/2013  Admission Diagnoses: Postdates, induction of labor        Anemia in pregnancy        GBS Positive    Discharge Diagnoses: S/P vacuum assisted vaginal birth          Anemia of mother in pregnancy, postpartum    Reason for Admission: induction of labor  Prenatal history: G1P1001   EDC : 04/08/2013, by Last Menstrual Period  [facility]atal care at [facility] since [redacted] weeks gestation Primary Provider: Dr. Billy Coast  Prenatal course complicated by anemia  Prenatal Labs: ABO, Rh: O POS (06/29 2020)  Antibody: NEG (06/29 2020) Rubella: Immune RPR: NON REACTIVE (06/29 2020)  HBsAg: Negative (11/21 0000)  HIV: Non-reactive (11/21 0000)  GBS: Positive (05/22 0000)    Labor Summary: Postdates Induction of Labor with Pitocin protocol, Vacuum assisted vaginal delivery d/t inadequate maternal expulsive efforts, GBS Prophylaxis  Anesthesia: epidural Procedures: vacuum assisted and repair of 2nd degree perineal laceration Complications: Urinary retention, immediate postpartum    Newborn Data:  Gender: female Feeding method : breast Circumcision: yes Home with mother.      Discharge Information: Date: Discharge Diagnoses: Term Pregnancy-delivered and Anemia  Activity: pelvic rest Diet: routine Medications: Ibuprofen and Iron Condition: stable Instructions: refer to practice specific booklet Discharge to: home Follow up : [facility] at 6 weeks postpartum  Signed: Kenard Gower, MSN, CNM 04/14/2013, 8:59 AM

## 2014-07-05 ENCOUNTER — Emergency Department (HOSPITAL_BASED_OUTPATIENT_CLINIC_OR_DEPARTMENT_OTHER)
Admission: EM | Admit: 2014-07-05 | Discharge: 2014-07-05 | Disposition: A | Discharge status: Home / Self Care | Payer: BC Managed Care – PPO | Attending: Emergency Medicine | Admitting: Emergency Medicine

## 2014-07-05 ENCOUNTER — Emergency Department (HOSPITAL_BASED_OUTPATIENT_CLINIC_OR_DEPARTMENT_OTHER): Payer: BC Managed Care – PPO

## 2014-07-05 ENCOUNTER — Encounter (HOSPITAL_BASED_OUTPATIENT_CLINIC_OR_DEPARTMENT_OTHER): Payer: Self-pay | Admitting: Emergency Medicine

## 2014-07-05 DIAGNOSIS — S199XXA Unspecified injury of neck, initial encounter: Secondary | ICD-10-CM

## 2014-07-05 DIAGNOSIS — Y9389 Activity, other specified: Secondary | ICD-10-CM | POA: Insufficient documentation

## 2014-07-05 DIAGNOSIS — S40019A Contusion of unspecified shoulder, initial encounter: Secondary | ICD-10-CM | POA: Diagnosis not present

## 2014-07-05 DIAGNOSIS — Y92009 Unspecified place in unspecified non-institutional (private) residence as the place of occurrence of the external cause: Secondary | ICD-10-CM | POA: Insufficient documentation

## 2014-07-05 DIAGNOSIS — M25512 Pain in left shoulder: Secondary | ICD-10-CM

## 2014-07-05 DIAGNOSIS — S46909A Unspecified injury of unspecified muscle, fascia and tendon at shoulder and upper arm level, unspecified arm, initial encounter: Secondary | ICD-10-CM | POA: Insufficient documentation

## 2014-07-05 DIAGNOSIS — W208XXA Other cause of strike by thrown, projected or falling object, initial encounter: Secondary | ICD-10-CM | POA: Diagnosis not present

## 2014-07-05 DIAGNOSIS — S4980XA Other specified injuries of shoulder and upper arm, unspecified arm, initial encounter: Secondary | ICD-10-CM | POA: Insufficient documentation

## 2014-07-05 DIAGNOSIS — S0993XA Unspecified injury of face, initial encounter: Secondary | ICD-10-CM | POA: Diagnosis not present

## 2014-07-05 DIAGNOSIS — D649 Anemia, unspecified: Secondary | ICD-10-CM | POA: Diagnosis not present

## 2014-07-05 DIAGNOSIS — T148XXA Other injury of unspecified body region, initial encounter: Secondary | ICD-10-CM

## 2014-07-05 DIAGNOSIS — M542 Cervicalgia: Secondary | ICD-10-CM

## 2014-07-05 MED ORDER — IBUPROFEN 800 MG PO TABS: 800.0000 mg | ORAL_TABLET | Freq: Three times a day (TID) | ORAL | Status: DC | PRN

## 2014-07-05 MED ORDER — IBUPROFEN 800 MG PO TABS
800.0000 mg | ORAL_TABLET | Freq: Once | ORAL | Status: AC
  Administered 2014-07-05: 800 mg via ORAL
  Filled 2014-07-05: qty 1

## 2014-07-05 MED ORDER — HYDROCODONE-ACETAMINOPHEN 5-325 MG PO TABS
1.0000 | ORAL_TABLET | ORAL | Status: DC | PRN
Start: 2014-07-05 — End: 2015-11-21

## 2014-07-05 NOTE — ED Provider Notes (Signed)
TIME SEEN: 9:49 PM  CHIEF COMPLAINT: Left shoulder and neck pain  HPI: Patient is a right-hand-dominant 28 year old female with no significant past medical history who presents to the emergency department with complaints of left shoulder and neck pain. She states that yesterday she was lifting a heavy trunk when it fell onto her left neck and shoulder. She states that she took ibuprofen before going to bed but woke up this morning with pain with raising her arm above her head. She has no midline spinal tenderness or other back pain. No numbness, tingling or focal weakness. No difficulty breathing. No abdominal pain. He did not hit her in the head. She did not lose consciousness. She is not on anticoagulation.  ROS: See HPI Constitutional: no fever  Eyes: no drainage  ENT: no runny nose   Cardiovascular:  no chest pain  Resp: no SOB  GI: no vomiting GU: no dysuria Integumentary: no rash  Allergy: no hives  Musculoskeletal: no leg swelling  Neurological: no slurred speech ROS otherwise negative  PAST MEDICAL HISTORY/PAST SURGICAL HISTORY:  Past Medical History  Diagnosis Date  . Anemia   . History of blood transfusion   . Anemia of mother in pregnancy, delivered with postpartum condition 04/13/2013  . Acute urinary retention - immediate PP 04/13/2013    MEDICATIONS:  Prior to Admission medications   Medication Sig Start Date End Date Taking? Authorizing Provider  ibuprofen (ADVIL,MOTRIN) 600 MG tablet Take 1 tablet (600 mg total) by mouth every 6 (six) hours. 04/14/13   Laury Deep, CNM  iron polysaccharides (NIFEREX) 150 MG capsule Take 1 capsule (150 mg total) by mouth 2 (two) times daily. BID x 2 weeks, then daily x 4 weeks 04/14/13   Laury Deep, CNM  Prenatal Vit-Fe Fumarate-FA (PRENATAL MULTIVITAMIN) TABS Take 1 tablet by mouth every morning.    Historical Provider, MD    ALLERGIES:  No Known Allergies  SOCIAL HISTORY:  History  Substance Use Topics  . Smoking status:  Never Smoker   . Smokeless tobacco: Never Used  . Alcohol Use: No    FAMILY HISTORY: No family history on file.  EXAM: BP 117/71  Pulse 75  Temp(Src) 98.3 F (36.8 C) (Oral)  Resp 16  Ht 5\' 9"  (1.753 m)  Wt 137 lb (62.143 kg)  BMI 20.22 kg/m2  SpO2 100%  LMP 06/04/2014 CONSTITUTIONAL: Alert and oriented and responds appropriately to questions. Well-appearing; well-nourished; GCS 15 HEAD: Normocephalic; atraumatic EYES: Conjunctivae clear, PERRL, EOMI ENT: normal nose; no rhinorrhea; moist mucous membranes; pharynx without lesions noted; no dental injury;  no septal hematoma, no changes in her voice, no stridor, no respiratory distress, swelling or secretions. No trismus or drooling NECK: Supple, no meningismus, no LAD; no midline spinal tenderness, step-off or deformity; tender to palpation over the left lateral neck with no sign of hematoma or ecchymosis or swelling CARD: RRR; S1 and S2 appreciated; no murmurs, no clicks, no rubs, no gallops RESP: Normal chest excursion without splinting or tachypnea; breath sounds clear and equal bilaterally; no wheezes, no rhonchi, no rales; chest wall stable, nontender to palpation ABD/GI: Normal bowel sounds; non-distended; soft, non-tender, no rebound, no guarding PELVIS:  stable, nontender to palpation BACK:  The back appears normal and is non-tender to palpation, there is no CVA tenderness; no midline spinal tenderness, step-off or deformity EXT: 2+ radial pulses bilaterally, normal ROM in all joints except some pain with full extension of the left shoulder, patient is mildly tender to palpation over the  anterior left shoulder and clavicle without obvious deformity or sign of dislocation, compartments are soft, otherwise extremities are non-tender to palpation; no edema; normal capillary refill; no cyanosis    SKIN: Normal color for age and race; warm NEURO: Moves all extremities equally, patient does have some pain with full extension of the  left arm but has full range of motion in his arm, sensation to light touch intact diffusely, equal grip strengths bilaterally, cranial nerves II through XII intact, normal gait PSYCH: The patient's mood and manner are appropriate. Grooming and personal hygiene are appropriate.  MEDICAL DECISION MAKING: Patient here she dropped a large trunk onto her left lateral neck and shoulder. There is no obvious deformity. She is well-appearing, hemodynamically stable and neurologically intact. We'll obtain an x-ray of the left shoulder. She has no midline cervical spine tenderness. Suspect contusions. No sign of hematoma. She denies any head injury.  ED PROGRESS: X-ray shows no acute fracture or dislocation. We'll discharge with prescription for ibuprofen and Vicodin. Have discussed with her supportive care instructions and return precautions. She verbalized understanding and is comfortable with plan.     [place], DO 07/05/14 1121

## 2014-07-05 NOTE — ED Notes (Signed)
Pt to ED co left shoulder pain. Pt stated was at home a box fell on left shoulder. No obvious deformity or swelling. Noticeable bruise on chest.

## 2014-07-05 NOTE — Discharge Instructions (Signed)
Cervical Sprain °A cervical sprain is an injury in the neck in which the strong, fibrous tissues (ligaments) that connect your neck bones stretch or tear. Cervical sprains can range from mild to severe. Severe cervical sprains can cause the neck vertebrae to be unstable. This can lead to damage of the spinal cord and can result in serious nervous system problems. The amount of time it takes for a cervical sprain to get better depends on the cause and extent of the injury. Most cervical sprains heal in 1 to 3 weeks. °CAUSES  °Severe cervical sprains may be caused by:  °· Contact sport injuries (such as from football, rugby, wrestling, hockey, auto racing, gymnastics, diving, martial arts, or boxing).   °· Motor vehicle collisions.   °· Whiplash injuries. This is an injury from a sudden forward and backward whipping movement of the head and neck.  °· Falls.   °Mild cervical sprains may be caused by:  °· Being in an awkward position, such as while cradling a telephone between your ear and shoulder.   °· Sitting in a chair that does not offer proper support.   °· Working at a poorly designed computer station.   °· Looking up or down for long periods of time.   °SYMPTOMS  °· Pain, soreness, stiffness, or a burning sensation in the front, back, or sides of the neck. This discomfort may develop immediately after the injury or slowly, 24 hours or more after the injury.   °· Pain or tenderness directly in the middle of the back of the neck.   °· Shoulder or upper back pain.   °· Limited ability to move the neck.   °· Headache.   °· Dizziness.   °· Weakness, numbness, or tingling in the hands or arms.   °· Muscle spasms.   °· Difficulty swallowing or chewing.   °· Tenderness and swelling of the neck.   °DIAGNOSIS  °Most of the time your health care provider can diagnose a cervical sprain by taking your history and doing a physical exam. Your health care provider will ask about previous neck injuries and any known neck  problems, such as arthritis in the neck. X-rays may be taken to find out if there are any other problems, such as with the bones of the neck. Other tests, such as a CT scan or MRI, may also be needed.  °TREATMENT  °Treatment depends on the severity of the cervical sprain. Mild sprains can be treated with rest, keeping the neck in place (immobilization), and pain medicines. Severe cervical sprains are immediately immobilized. Further treatment is done to help with pain, muscle spasms, and other symptoms and may include: °· Medicines, such as pain relievers, numbing medicines, or muscle relaxants.   °· Physical therapy. This may involve stretching exercises, strengthening exercises, and posture training. Exercises and improved posture can help stabilize the neck, strengthen muscles, and help stop symptoms from returning.   °HOME CARE INSTRUCTIONS  °· Put ice on the injured area.   °¨ Put ice in a plastic bag.   °¨ Place a towel between your skin and the bag.   °¨ Leave the ice on for 15-20 minutes, 3-4 times a day.   °· If your injury was severe, you may have been given a cervical collar to wear. A cervical collar is a two-piece collar designed to keep your neck from moving while it heals. °¨ Do not remove the collar unless instructed by your health care provider. °¨ If you have long hair, keep it outside of the collar. °¨ Ask your health care provider before making any adjustments to your collar. Minor   adjustments may be required over time to improve comfort and reduce pressure on your chin or on the back of your head.  Ifyou are allowed to remove the collar for cleaning or bathing, follow your health care provider's instructions on how to do so safely.  Keep your collar clean by wiping it with mild soap and water and drying it completely. If the collar you have been given includes removable pads, remove them every 1-2 days and hand wash them with soap and water. Allow them to air dry. They should be completely  dry before you wear them in the collar.  If you are allowed to remove the collar for cleaning and bathing, wash and dry the skin of your neck. Check your skin for irritation or sores. If you see any, tell your health care provider.  Do not drive while wearing the collar.   Only take over-the-counter or prescription medicines for pain, discomfort, or fever as directed by your health care provider.   Keep all follow-up appointments as directed by your health care provider.   Keep all physical therapy appointments as directed by your health care provider.   Make any needed adjustments to your workstation to promote good posture.   Avoid positions and activities that make your symptoms worse.   Warm up and stretch before being active to help prevent problems.  SEEK MEDICAL CARE IF:   Your pain is not controlled with medicine.   You are unable to decrease your pain medicine over time as planned.   Your activity level is not improving as expected.  SEEK IMMEDIATE MEDICAL CARE IF:   You develop any bleeding.  You develop stomach upset.  You have signs of an allergic reaction to your medicine.   Your symptoms get worse.   You develop new, unexplained symptoms.   You have numbness, tingling, weakness, or paralysis in any part of your body.  MAKE SURE YOU:   Understand these instructions.  Will watch your condition.  Will get help right away if you are not doing well or get worse. Document Released: 07/28/2007 Document Revised: 10/05/2013 Document Reviewed: 04/07/2013 [facility] Patient Information 2015 [place], [place]. This information is not intended to replace advice given to you by your health care provider. Make sure you discuss any questions you have with your health care provider.  Contusion A contusion is a deep bruise. Contusions are the result of an injury that caused bleeding under the skin. The contusion may turn blue, purple, or yellow. Minor injuries  will give you a painless contusion, but more severe contusions may stay painful and swollen for a few weeks.  CAUSES  A contusion is usually caused by a blow, trauma, or direct force to an area of the body. SYMPTOMS   Swelling and redness of the injured area.  Bruising of the injured area.  Tenderness and soreness of the injured area.  Pain. DIAGNOSIS  The diagnosis can be made by taking a history and physical exam. An X-ray, CT scan, or MRI may be needed to determine if there were any associated injuries, such as fractures. TREATMENT  Specific treatment will depend on what area of the body was injured. In general, the best treatment for a contusion is resting, icing, elevating, and applying cold compresses to the injured area. Over-the-counter medicines may also be recommended for pain control. Ask your caregiver what the best treatment is for your contusion. HOME CARE INSTRUCTIONS   Put ice on the injured area.  Put ice  in a plastic bag.  Place a towel between your skin and the bag.  Leave the ice on for 15-20 minutes, 3-4 times a day, or as directed by your health care provider.  Only take over-the-counter or prescription medicines for pain, discomfort, or fever as directed by your caregiver. Your caregiver may recommend avoiding anti-inflammatory medicines (aspirin, ibuprofen, and naproxen) for 48 hours because these medicines may increase bruising.  Rest the injured area.  If possible, elevate the injured area to reduce swelling. SEEK IMMEDIATE MEDICAL CARE IF:   You have increased bruising or swelling.  You have pain that is getting worse.  Your swelling or pain is not relieved with medicines. MAKE SURE YOU:   Understand these instructions.  Will watch your condition.  Will get help right away if you are not doing well or get worse. Document Released: 07/10/2005 Document Revised: 10/05/2013 Document Reviewed: 08/05/2011 [facility] Patient Information 2015 [place],  [place]. This information is not intended to replace advice given to you by your health care provider. Make sure you discuss any questions you have with your health care provider. RICE: Routine Care for Injuries The routine care of many injuries includes Rest, Ice, Compression, and Elevation (RICE). HOME CARE INSTRUCTIONS  Rest is needed to allow your body to heal. Routine activities can usually be resumed when comfortable. Injured tendons and bones can take up to 6 weeks to heal. Tendons are the cord-like structures that attach muscle to bone.  Ice following an injury helps keep the swelling down and reduces pain.  Put ice in a plastic bag.  Place a towel between your skin and the bag.  Leave the ice on for 15-20 minutes, 3-4 times a day, or as directed by your health care provider. Do this while awake, for the first 24 to 48 hours. After that, continue as directed by your caregiver.  Compression helps keep swelling down. It also gives support and helps with discomfort. If an elastic bandage has been applied, it should be removed and reapplied every 3 to 4 hours. It should not be applied tightly, but firmly enough to keep swelling down. Watch fingers or toes for swelling, bluish discoloration, coldness, numbness, or excessive pain. If any of these problems occur, remove the bandage and reapply loosely. Contact your caregiver if these problems continue.  Elevation helps reduce swelling and decreases pain. With extremities, such as the arms, hands, legs, and feet, the injured area should be placed near or above the level of the heart, if possible. SEEK IMMEDIATE MEDICAL CARE IF:  You have persistent pain and swelling.  You develop redness, numbness, or unexpected weakness.  Your symptoms are getting worse rather than improving after several days. These symptoms may indicate that further evaluation or further X-rays are needed. Sometimes, X-rays may not show a small broken bone (fracture) until 1  week or 10 days later. Make a follow-up appointment with your caregiver. Ask when your X-ray results will be ready. Make sure you get your X-ray results. Document Released: 01/12/2001 Document Revised: 10/05/2013 Document Reviewed: 03/01/2011 [facility] Patient Information 2015 [place], [place]. This information is not intended to replace advice given to you by your health care provider. Make sure you discuss any questions you have with your health care provider.

## 2014-08-15 ENCOUNTER — Encounter (HOSPITAL_BASED_OUTPATIENT_CLINIC_OR_DEPARTMENT_OTHER): Payer: Self-pay | Admitting: Emergency Medicine

## 2015-01-26 ENCOUNTER — Other Ambulatory Visit: Payer: Self-pay | Admitting: Gynecology

## 2015-01-27 LAB — CYTOLOGY - PAP

## 2015-05-29 ENCOUNTER — Inpatient Hospital Stay (HOSPITAL_COMMUNITY)
Admission: AD | Admit: 2015-05-29 | Discharge: 2015-05-29 | Disposition: A | Discharge status: Home / Self Care | Payer: BLUE CROSS/BLUE SHIELD | Source: Ambulatory Visit | Attending: Obstetrics & Gynecology | Admitting: Obstetrics & Gynecology

## 2015-05-29 ENCOUNTER — Inpatient Hospital Stay (HOSPITAL_COMMUNITY): Payer: BLUE CROSS/BLUE SHIELD

## 2015-05-29 ENCOUNTER — Encounter (HOSPITAL_COMMUNITY): Payer: Self-pay | Admitting: Family

## 2015-05-29 DIAGNOSIS — Z886 Allergy status to analgesic agent status: Secondary | ICD-10-CM | POA: Diagnosis not present

## 2015-05-29 DIAGNOSIS — N939 Abnormal uterine and vaginal bleeding, unspecified: Secondary | ICD-10-CM

## 2015-05-29 DIAGNOSIS — R109 Unspecified abdominal pain: Secondary | ICD-10-CM | POA: Diagnosis present

## 2015-05-29 LAB — CBC
HEMATOCRIT: 33.8 % — AB (ref 36.0–46.0)
Hemoglobin: 11.1 g/dL — ABNORMAL LOW (ref 12.0–15.0)
MCH: 28.5 pg (ref 26.0–34.0)
MCHC: 32.8 g/dL (ref 30.0–36.0)
MCV: 86.7 fL (ref 78.0–100.0)
PLATELETS: 166 10*3/uL (ref 150–400)
RBC: 3.9 MIL/uL (ref 3.87–5.11)
RDW: 14.2 % (ref 11.5–15.5)
WBC: 6 10*3/uL (ref 4.0–10.5)

## 2015-05-29 LAB — URINE MICROSCOPIC-ADD ON

## 2015-05-29 LAB — TYPE AND SCREEN
ABO/RH(D): O POS
ANTIBODY SCREEN: NEGATIVE

## 2015-05-29 LAB — URINALYSIS, ROUTINE W REFLEX MICROSCOPIC
Bilirubin Urine: NEGATIVE
Glucose, UA: NEGATIVE mg/dL
KETONES UR: 15 mg/dL — AB
LEUKOCYTES UA: NEGATIVE
NITRITE: NEGATIVE
PROTEIN: 100 mg/dL — AB
Specific Gravity, Urine: >1.03 — ABNORMAL HIGH (ref 1.005–1.030)
UROBILINOGEN UA: 1 mg/dL (ref 0.0–1.0)
pH: 6 (ref 5.0–8.0)

## 2015-05-29 LAB — HCG, QUANTITATIVE, PREGNANCY: hCG, Beta Chain, Quant, S: 32 m[IU]/mL — ABNORMAL HIGH (ref <5)

## 2015-05-29 LAB — POCT PREGNANCY, URINE: PREG TEST UR: POSITIVE — AB

## 2015-05-29 MED ORDER — MISOPROSTOL 200 MCG PO TABS: ORAL_TABLET | ORAL | Status: DC

## 2015-05-29 NOTE — MAU Note (Signed)
Pt c/o lower abdominal cramping and vaginal bleeding. Pt states that she has had the vaginal bleeding for a while now but tonight around 9pm it got heavier. Had a TAB on 05/01/2015. States that she is changing her pad every 10 mins since 9pm.

## 2015-05-29 NOTE — MAU Provider Note (Signed)
History     CSN: [id number]  Arrival date and time: 05/29/15 0035   None     Chief Complaint  Patient presents with  . Vaginal Bleeding   HPI  [name] is a [age] here with report of lower abdominal cramping and vaginal bleeding. Pt states that she has had the vaginal bleeding for approximately two weeks .  Bleeding increased tonight around 9pm. Reports having and elective abortion on 05/01/2015. Bleeding was like a period for about 3 weeks after procedure.  Pt states that she is changing her pad every 10 mins since 9pm.   Past Medical History  Diagnosis Date  . Anemia   . History of blood transfusion   . Anemia of mother in pregnancy, delivered with postpartum condition 04/13/2013  . Acute urinary retention - immediate PP 04/13/2013    Past Surgical History  Procedure Laterality Date  . Leep  2009  . Wisdom tooth extraction  2006  . Therapeutic abortion      History reviewed. No pertinent family history.  Social History  Substance Use Topics  . Smoking status: Never Smoker   . Smokeless tobacco: Never Used  . Alcohol Use: No    Allergies: No Known Allergies  Prescriptions prior to admission  Medication Sig Dispense Refill Last Dose  . HYDROcodone-acetaminophen (NORCO/VICODIN) 5-325 MG per tablet Take 1 tablet by mouth every 4 (four) hours as needed. 15 tablet 0 More than a month at Unknown time  . ibuprofen (ADVIL,MOTRIN) 600 MG tablet Take 1 tablet (600 mg total) by mouth every 6 (six) hours. 30 tablet 0 More than a month at Unknown time  . ibuprofen (ADVIL,MOTRIN) 800 MG tablet Take 1 tablet (800 mg total) by mouth every 8 (eight) hours as needed for mild pain. 30 tablet 0 More than a month at Unknown time  . iron polysaccharides (NIFEREX) 150 MG capsule Take 1 capsule (150 mg total) by mouth 2 (two) times daily. BID x 2 weeks, then daily x 4 weeks 60 capsule 0 More than a month at Unknown time  . Prenatal Vit-Fe Fumarate-FA (PRENATAL MULTIVITAMIN) TABS  Take 1 tablet by mouth every morning.   More than a month at Unknown time    ROS Physical Exam   Blood pressure 134/74, pulse 76, temperature 98.8 F (37.1 C), temperature source Oral, resp. rate 18, height 5' 8.5" (1.74 m), weight 62.778 kg (138 lb 6.4 oz), last menstrual period 03/09/2015, SpO2 100 %, unknown if currently breastfeeding.  Physical Exam  Constitutional: She is oriented to person, place, and time. She appears well-developed and well-nourished. No distress.  Uncomfortable appearing  HENT:  Head: Normocephalic.  Neck: Normal range of motion. Neck supple.  Cardiovascular: Normal rate, regular rhythm and normal heart sounds.  Exam reveals no gallop and no friction rub.   No murmur heard. Respiratory: Effort normal and breath sounds normal. No respiratory distress.  GI: Soft. She exhibits no mass. There is tenderness (suprapubic). There is no rebound and no guarding.  Genitourinary: There is bleeding (moderate; +clots) in the vagina.  Neurological: She is alert and oriented to person, place, and time.  Skin: Skin is warm and dry.    MAU Course  Procedures  MDM Results for orders placed or performed during the hospital encounter of 05/29/15 (from the past 24 hour(s))  Urinalysis, Routine w reflex microscopic (not at [facility])     Status: Abnormal   Collection Time: 05/29/15 12:44 AM  Result Value Ref Range  Color, Urine RED (A) YELLOW   APPearance CLOUDY (A) CLEAR   Specific Gravity, Urine >1.030 (H) 1.005 - 1.030   pH 6.0 5.0 - 8.0   Glucose, UA NEGATIVE NEGATIVE mg/dL   Hgb urine dipstick LARGE (A) NEGATIVE   Bilirubin Urine NEGATIVE NEGATIVE   Ketones, ur 15 (A) NEGATIVE mg/dL   Protein, ur 100 (A) NEGATIVE mg/dL   Urobilinogen, UA 1.0 0.0 - 1.0 mg/dL   Nitrite NEGATIVE NEGATIVE   Leukocytes, UA NEGATIVE NEGATIVE  Urine microscopic-add on     Status: Abnormal   Collection Time: 05/29/15 12:44 AM  Result Value Ref Range   Squamous Epithelial / LPF FEW (A) RARE    WBC, UA 0-2 <3 WBC/hpf   RBC / HPF TOO NUMEROUS TO COUNT <3 RBC/hpf   Bacteria, UA RARE RARE   Urine-Other URINALYSIS PERFORMED ON SUPERNATANT   Pregnancy, urine POC     Status: Abnormal   Collection Time: 05/29/15  1:11 AM  Result Value Ref Range   Preg Test, Ur POSITIVE (A) NEGATIVE  CBC     Status: Abnormal   Collection Time: 05/29/15  1:38 AM  Result Value Ref Range   WBC 6.0 4.0 - 10.5 K/uL   RBC 3.90 3.87 - 5.11 MIL/uL   Hemoglobin 11.1 (L) 12.0 - 15.0 g/dL   HCT 33.8 (L) 36.0 - 46.0 %   MCV 86.7 78.0 - 100.0 fL   MCH 28.5 26.0 - 34.0 pg   MCHC 32.8 30.0 - 36.0 g/dL   RDW 14.2 11.5 - 15.5 %   Platelets 166 150 - 400 K/uL  hCG, quantitative, pregnancy     Status: Abnormal   Collection Time: 05/29/15  1:38 AM  Result Value Ref Range   hCG, Beta Chain, Quant, S 32 (H) <5 mIU/mL  Type and screen     Status: None   Collection Time: 05/29/15  1:38 AM  Result Value Ref Range   ABO/RH(D) O POS    Antibody Screen NEG    Sample Expiration 06/01/2015    Ultrasound: Endometrium  Thickness: Thickened at 3.4 cm, echogenic with mild vascularity. No focal abnormality visualized.  Right ovary  Measurements: 3.1 x 1.8 x 1.6 cm. Normal appearance/no adnexal mass.  Left ovary  Measurements: 3 x 1.3 x 2.5 cm. Normal appearance/no adnexal mass.  Other findings  No free fluid.  IMPRESSION: Findings compatible with retained products of conception.  Otherwise unremarkable pelvic ultrasound.  27 Consulted with Dr. Ihor Dow > Reviewed HPI/Exam/labs/surgical history/ultrasound > discharge home since stable with orders for cytotec placement and follow-up in clinic in one week Assessment and Plan  Abnormal Uterine Bleeding s/p Elective AB  Plan Discharge to home RX cytotec 800 mcg vaginally RX Ibuprofen 600 mg Reviewed bleeding precautions Follow-up in clinic in one week   KARIM, [facility] N 05/29/2015, 4:01 AM

## 2015-06-05 ENCOUNTER — Ambulatory Visit: Payer: BLUE CROSS/BLUE SHIELD | Admitting: Obstetrics and Gynecology

## 2015-11-22 ENCOUNTER — Encounter (HOSPITAL_COMMUNITY)
Admission: RE | Disposition: A | Discharge status: Home / Self Care | Payer: Self-pay | Source: Ambulatory Visit | Attending: Obstetrics and Gynecology

## 2015-11-22 ENCOUNTER — Encounter (HOSPITAL_COMMUNITY): Payer: Self-pay

## 2015-11-22 ENCOUNTER — Ambulatory Visit (HOSPITAL_COMMUNITY): Payer: BLUE CROSS/BLUE SHIELD | Admitting: Anesthesiology

## 2015-11-22 ENCOUNTER — Ambulatory Visit (HOSPITAL_COMMUNITY)
Admission: RE | Admit: 2015-11-22 | Discharge: 2015-11-22 | Disposition: A | Discharge status: Home / Self Care | Payer: BLUE CROSS/BLUE SHIELD | Source: Ambulatory Visit | Attending: Obstetrics and Gynecology | Admitting: Obstetrics and Gynecology

## 2015-11-22 DIAGNOSIS — O021 Missed abortion: Secondary | ICD-10-CM | POA: Diagnosis present

## 2015-11-22 HISTORY — PX: DILATION AND EVACUATION: SHX1459

## 2015-11-22 LAB — CBC
HCT: 33.3 % — ABNORMAL LOW (ref 36.0–46.0)
Hemoglobin: 11.1 g/dL — ABNORMAL LOW (ref 12.0–15.0)
MCH: 26.8 pg (ref 26.0–34.0)
MCHC: 33.3 g/dL (ref 30.0–36.0)
MCV: 80.4 fL (ref 78.0–100.0)
PLATELETS: 193 10*3/uL (ref 150–400)
RBC: 4.14 MIL/uL (ref 3.87–5.11)
RDW: 15.4 % (ref 11.5–15.5)
WBC: 5.3 10*3/uL (ref 4.0–10.5)

## 2015-11-22 SURGERY — DILATION AND EVACUATION, UTERUS
Anesthesia: Monitor Anesthesia Care

## 2015-11-22 MED ORDER — FENTANYL CITRATE (PF) 100 MCG/2ML IJ SOLN: 25.0000 ug | INTRAMUSCULAR | Status: DC | PRN

## 2015-11-22 MED ORDER — LIDOCAINE HCL (CARDIAC) 20 MG/ML IV SOLN
INTRAVENOUS | Status: AC
  Filled 2015-11-22: qty 5

## 2015-11-22 MED ORDER — HYDROCODONE-ACETAMINOPHEN 7.5-325 MG PO TABS: 1.0000 | ORAL_TABLET | Freq: Once | ORAL | Status: DC | PRN

## 2015-11-22 MED ORDER — LACTATED RINGERS IV SOLN
INTRAVENOUS | Status: DC
  Administered 2015-11-22: 09:00:00 via INTRAVENOUS
  Administered 2015-11-22: 125 mL/h via INTRAVENOUS

## 2015-11-22 MED ORDER — PROPOFOL 10 MG/ML IV BOLUS
INTRAVENOUS | Status: AC
  Filled 2015-11-22: qty 20

## 2015-11-22 MED ORDER — DEXAMETHASONE SODIUM PHOSPHATE 10 MG/ML IJ SOLN
INTRAMUSCULAR | Status: DC | PRN
  Administered 2015-11-22: 4 mg via INTRAVENOUS

## 2015-11-22 MED ORDER — ONDANSETRON HCL 4 MG/2ML IJ SOLN
INTRAMUSCULAR | Status: DC | PRN
  Administered 2015-11-22: 4 mg via INTRAVENOUS

## 2015-11-22 MED ORDER — DEXAMETHASONE SODIUM PHOSPHATE 4 MG/ML IJ SOLN
INTRAMUSCULAR | Status: AC
  Filled 2015-11-22: qty 1

## 2015-11-22 MED ORDER — MIDAZOLAM HCL 2 MG/2ML IJ SOLN
INTRAMUSCULAR | Status: AC
  Filled 2015-11-22: qty 2

## 2015-11-22 MED ORDER — FENTANYL CITRATE (PF) 100 MCG/2ML IJ SOLN
INTRAMUSCULAR | Status: DC | PRN
  Administered 2015-11-22: 100 ug via INTRAVENOUS

## 2015-11-22 MED ORDER — ONDANSETRON HCL 4 MG/2ML IJ SOLN
INTRAMUSCULAR | Status: AC
  Filled 2015-11-22: qty 2

## 2015-11-22 MED ORDER — SCOPOLAMINE 1 MG/3DAYS TD PT72
MEDICATED_PATCH | TRANSDERMAL | Status: AC
  Administered 2015-11-22: 1.5 mg via TRANSDERMAL
  Filled 2015-11-22: qty 1

## 2015-11-22 MED ORDER — MIDAZOLAM HCL 2 MG/2ML IJ SOLN
INTRAMUSCULAR | Status: DC | PRN
  Administered 2015-11-22: 2 mg via INTRAVENOUS

## 2015-11-22 MED ORDER — KETOROLAC TROMETHAMINE 30 MG/ML IJ SOLN
INTRAMUSCULAR | Status: AC
  Filled 2015-11-22: qty 1

## 2015-11-22 MED ORDER — LIDOCAINE HCL 2 % IJ SOLN
INTRAMUSCULAR | Status: DC | PRN
  Administered 2015-11-22: 16 mL via INTRADERMAL

## 2015-11-22 MED ORDER — SCOPOLAMINE 1 MG/3DAYS TD PT72
1.0000 | MEDICATED_PATCH | Freq: Once | TRANSDERMAL | Status: DC
  Administered 2015-11-22: 1.5 mg via TRANSDERMAL

## 2015-11-22 MED ORDER — FENTANYL CITRATE (PF) 250 MCG/5ML IJ SOLN
INTRAMUSCULAR | Status: AC
  Filled 2015-11-22: qty 5

## 2015-11-22 MED ORDER — KETOROLAC TROMETHAMINE 30 MG/ML IJ SOLN
INTRAMUSCULAR | Status: DC | PRN
  Administered 2015-11-22: 30 mg via INTRAVENOUS

## 2015-11-22 MED ORDER — PROPOFOL 10 MG/ML IV BOLUS
INTRAVENOUS | Status: DC | PRN
  Administered 2015-11-22: 10 mg via INTRAVENOUS
  Administered 2015-11-22 (×2): 20 mg via INTRAVENOUS

## 2015-11-22 MED ORDER — KETOROLAC TROMETHAMINE 30 MG/ML IJ SOLN: 30.0000 mg | Freq: Once | INTRAMUSCULAR | Status: DC

## 2015-11-22 MED ORDER — LIDOCAINE HCL (CARDIAC) 20 MG/ML IV SOLN
INTRAVENOUS | Status: DC | PRN
  Administered 2015-11-22: 100 mg via INTRAVENOUS

## 2015-11-22 MED ORDER — PROMETHAZINE HCL 25 MG/ML IJ SOLN: 6.2500 mg | INTRAMUSCULAR | Status: DC | PRN

## 2015-11-22 MED ORDER — LIDOCAINE HCL 2 % IJ SOLN
INTRAMUSCULAR | Status: AC
  Filled 2015-11-22: qty 20

## 2015-11-22 MED ORDER — DEXTROSE 5 % IV SOLN
100.0000 mg | Freq: Two times a day (BID) | INTRAVENOUS | Status: DC
  Filled 2015-11-22 (×4): qty 100

## 2015-11-22 MED ORDER — DOXYCYCLINE HYCLATE 100 MG IV SOLR
100.0000 mg | Freq: Once | INTRAVENOUS | Status: AC
  Administered 2015-11-22: 100 mg via INTRAVENOUS
  Filled 2015-11-22: qty 100

## 2015-11-22 SURGICAL SUPPLY — 19 items
CATH ROBINSON RED A/P 16FR (CATHETERS) ×3 IMPLANT
CLOTH BEACON ORANGE TIMEOUT ST (SAFETY) ×3 IMPLANT
DECANTER SPIKE VIAL GLASS SM (MISCELLANEOUS) ×3 IMPLANT
GLOVE BIO SURGEON STRL SZ8 (GLOVE) ×3 IMPLANT
GLOVE BIOGEL PI IND STRL 7.0 (GLOVE) ×1 IMPLANT
GLOVE BIOGEL PI INDICATOR 7.0 (GLOVE) ×2
GLOVE ORTHO TXT STRL SZ7.5 (GLOVE) ×3 IMPLANT
GOWN STRL REUS W/TWL LRG LVL3 (GOWN DISPOSABLE) ×6 IMPLANT
KIT BERKELEY 1ST TRIMESTER 3/8 (MISCELLANEOUS) ×3 IMPLANT
NS IRRIG 1000ML POUR BTL (IV SOLUTION) ×3 IMPLANT
PACK VAGINAL MINOR WOMEN LF (CUSTOM PROCEDURE TRAY) ×3 IMPLANT
PAD OB MATERNITY 4.3X12.25 (PERSONAL CARE ITEMS) ×3 IMPLANT
PAD PREP 24X48 CUFFED NSTRL (MISCELLANEOUS) ×3 IMPLANT
SET BERKELEY SUCTION TUBING (SUCTIONS) ×3 IMPLANT
TOWEL OR 17X24 6PK STRL BLUE (TOWEL DISPOSABLE) ×6 IMPLANT
VACURETTE 10 RIGID CVD (CANNULA) IMPLANT
VACURETTE 7MM CVD STRL WRAP (CANNULA) ×3 IMPLANT
VACURETTE 8 RIGID CVD (CANNULA) IMPLANT
VACURETTE 9 RIGID CVD (CANNULA) IMPLANT

## 2015-11-22 NOTE — H&P (Signed)
Ashley Shaw is an 30 y.o. female. She was seen in the office yesterday for new OB confirmation, found to have 5-6 week missed abortion.  LMP 12-4, +UPT on Jan 7.  She is not having any significant bleeding or pain.  Options were discussed, and with her history of anemia and bleeding she desires to proceed with D&E.  Pertinent Gynecological History: OB History: G4, P1031   Menstrual History: No LMP recorded.    Past Medical History  Diagnosis Date  . Anemia   . History of blood transfusion   . Anemia of mother in pregnancy, delivered with postpartum condition 04/13/2013  . Acute urinary retention - immediate PP 04/13/2013    Past Surgical History  Procedure Laterality Date  . Leep  2009  . Wisdom tooth extraction  2006  . Therapeutic abortion      History reviewed. No pertinent family history.  Social History:  reports that she has never smoked. She has never used smokeless tobacco. She reports that she does not drink alcohol or use illicit drugs.  Allergies: No Known Allergies  Prescriptions prior to admission  Medication Sig Dispense Refill Last Dose  . Doxylamine-Pyridoxine (DICLEGIS) 10-10 MG TBEC Take 1 tablet by mouth at bedtime as needed (nausea).   11/20/2015  . Prenatal Vit-Fe Fumarate-FA (PRENATAL MULTIVITAMIN) TABS Take 1 tablet by mouth every morning.   11/20/2015 at Unknown time    Review of Systems  Respiratory: Negative.   Cardiovascular: Negative.   Gastrointestinal: Negative.   Genitourinary: Negative.     Pulse 79, temperature 98.8 F (37.1 C), temperature source Oral, resp. rate 20, height 5\' 9"  (1.753 m), weight 61.009 kg (134 lb 8 oz), SpO2 100 %, unknown if currently breastfeeding. Physical Exam  Constitutional: She appears well-developed and well-nourished.  Cardiovascular: Normal rate, regular rhythm and normal heart sounds.   No murmur heard. Respiratory: Effort normal and breath sounds normal. No respiratory distress. She has no wheezes.  GI:  Soft. She exhibits no distension and no mass. There is no tenderness.  Genitourinary: Vagina normal and uterus normal.  No adnexal mass    No results found for this or any previous visit (from the past 24 hour(s)).  No results found.  Assessment/Plan: Missed abortion.  Options have been discussed, risks of surgery discussed.  Will admit for D&E.    Lexington Devine D 11/22/2015, 7:41 AM

## 2015-11-22 NOTE — Anesthesia Preprocedure Evaluation (Addendum)
Anesthesia Evaluation  Patient identified by MRN, date of birth, ID band Patient awake    Reviewed: Allergy & Precautions, NPO status , Patient's Chart, lab work & pertinent test results  Airway Mallampati: II  TM Distance: >3 FB Neck ROM: Full    Dental   Pulmonary neg pulmonary ROS,    breath sounds clear to auscultation       Cardiovascular negative cardio ROS   Rhythm:Regular Rate:Normal     Neuro/Psych negative neurological ROS     GI/Hepatic negative GI ROS, Neg liver ROS,   Endo/Other  negative endocrine ROS  Renal/GU negative Renal ROS     Musculoskeletal   Abdominal   Peds  Hematology  (+) anemia ,   Anesthesia Other Findings   Reproductive/Obstetrics Missed AB                            Lab Results  Component Value Date   WBC 6.0 05/29/2015   HGB 11.1* 05/29/2015   HCT 33.8* 05/29/2015   MCV 86.7 05/29/2015   PLT 166 05/29/2015   Lab Results  Component Value Date   CREATININE 0.66 11/10/2007   BUN 7 11/10/2007   NA 137 11/10/2007   K 3.7 11/10/2007   CL 104 11/10/2007   CO2 23 11/10/2007    Anesthesia Physical Anesthesia Plan  ASA: II  Anesthesia Plan: MAC   Post-op Pain Management:    Induction: Intravenous  Airway Management Planned: Natural Airway and Simple Face Mask  Additional Equipment:   Intra-op Plan:   Post-operative Plan:   Informed Consent: I have reviewed the patients History and Physical, chart, labs and discussed the procedure including the risks, benefits and alternatives for the proposed anesthesia with the patient or authorized representative who has indicated his/her understanding and acceptance.     Plan Discussed with: CRNA  Anesthesia Plan Comments:         Anesthesia Quick Evaluation

## 2015-11-22 NOTE — Transfer of Care (Signed)
Immediate Anesthesia Transfer of Care Note  Patient: Ashley Shaw  Procedure(s) Performed: Procedure(s): DILATATION AND EVACUATION (N/A)  Patient Location: PACU  Anesthesia Type:MAC  Level of Consciousness: sedated  Airway & Oxygen Therapy: Patient Spontanous Breathing  Post-op Assessment: Report given to RN  Post vital signs: Reviewed and stable  Last Vitals:  Filed Vitals:   11/22/15 0717  Pulse: 79  Temp: 37.1 C  Resp: 20    Complications: No apparent anesthesia complications

## 2015-11-22 NOTE — Op Note (Signed)
  Preoperative Diagnosis:  Missed abortion Postop Diagnosis:  Same Procedure:  D&E Anesthesia:  MAC, paracervical block Findings: Cervix was closed, uterus was normal size, moderate products of conception were obtained Specimens: Products of conception sent for routine pathology Estimated blood loss: Minimal Complications: None  Procedure in detail: The patient was taken to the operating room and placed in the dorsosupine position. IV sedation was given and she was placed and mobile stirrups. Perineum and vagina were prepped and draped in the usual sterile fashion. A Graves speculum was inserted in the vagina. The anterior lip of the cervix was grasped with a single-tooth tenaculum. Paracervical block was then performed with a total of 16 cc of 2% plain lidocaine. Uterus then sounded to 9 cm. Cervix was gradually easily dilated to size 25 dilator. A size 7 curved suction curet was then inserted without difficulty. Suction curettage was performed with return of abundant products of conception. Sharp curettage was performed with which revealed good uterine cry in all quadrants and no significant tissue. Suction curettage was performed one more time which revealed minimal blood. The single-tooth tenaculum was removed from the cervix and bleeding was controlled with pressure. All instruments were removed from the vagina. The patient was taken to the recovery in stable condition after tolerating the procedure well. Counts were correct, she received doxycycline at the beginning of the procedure and had PAS hose on throughout the procedure.

## 2015-11-22 NOTE — Interval H&P Note (Signed)
History and Physical Interval Note:  11/22/2015 8:04 AM  Ashley Shaw  has presented today for surgery, with the diagnosis of MISSED ABORTION  The various methods of treatment have been discussed with the patient and family. After consideration of risks, benefits and other options for treatment, the patient has consented to  Procedure(s): DILATATION AND EVACUATION (N/A) as a surgical intervention .  The patient's history has been reviewed, patient examined, no change in status, stable for surgery.  I have reviewed the patient's chart and labs.  Questions were answered to the patient's satisfaction.     Natilee Gauer D

## 2015-11-22 NOTE — Discharge Instructions (Signed)
Routine instructions for D&EDISCHARGE INSTRUCTIONS: D&C / D&E The following instructions have been prepared to help you care for yourself upon your return home.   Personal hygiene:  Use sanitary pads for vaginal drainage, not tampons.  Shower the day after your procedure.  NO tub baths, pools or Jacuzzis for 2-3 weeks.  Wipe front to back after using the bathroom.  Activity and limitations:  Do NOT drive or operate any equipment for 24 hours. The effects of anesthesia are still present and drowsiness may result.  Do NOT rest in bed all day.  Walking is encouraged.  Walk up and down stairs slowly.  You may resume your normal activity in one to two days or as indicated by your physician.  Sexual activity: NO intercourse for at least 2 weeks after the procedure, or as indicated by your physician.  Diet: Eat a light meal as desired this evening. You may resume your usual diet tomorrow.  Return to work: You may resume your work activities in one to two days or as indicated by your doctor.  What to expect after your surgery: Expect to have vaginal bleeding/discharge for 2-3 days and spotting for up to 10 days. It is not unusual to have soreness for up to 1-2 weeks. You may have a slight burning sensation when you urinate for the first day. Mild cramps may continue for a couple of days. You may have a regular period in 2-6 weeks.  Call your doctor for any of the following:  Excessive vaginal bleeding, saturating and changing one pad every hour.  Inability to urinate 6 hours after discharge from hospital.  Pain not relieved by pain medication.  Fever of 100.4 F or greater.  Unusual vaginal discharge or odor.   Call for an appointment:    Patients signature: ______________________  Nurses signature ________________________  Support person's signature_______________________    Post Anesthesia Home Care Instructions  Activity: Get plenty of rest for the remainder of  the day. A responsible adult should stay with you for 24 hours following the procedure.  For the next 24 hours, DO NOT: -Drive a car -Paediatric nurse -Drink alcoholic beverages -Take any medication unless instructed by your physician -Make any legal decisions or sign important papers.  Meals: Start with liquid foods such as gelatin or soup. Progress to regular foods as tolerated. Avoid greasy, spicy, heavy foods. If nausea and/or vomiting occur, drink only clear liquids until the nausea and/or vomiting subsides. Call your physician if vomiting continues.  Special Instructions/Symptoms: Your throat may feel dry or sore from the anesthesia or the breathing tube placed in your throat during surgery. If this causes discomfort, gargle with warm salt water. The discomfort should disappear within 24 hours.  If you had a scopolamine patch placed behind your ear for the management of post- operative nausea and/or vomiting:  1. The medication in the patch is effective for 72 hours, after which it should be removed.  Wrap patch in a tissue and discard in the trash. Wash hands thoroughly with soap and water. 2. You may remove the patch earlier than 72 hours if you experience unpleasant side effects which may include dry mouth, dizziness or visual disturbances. 3. Avoid touching the patch. Wash your hands with soap and water after contact with the patch.

## 2015-11-22 NOTE — Anesthesia Postprocedure Evaluation (Signed)
Anesthesia Post Note  Patient: Ashley Shaw  Procedure(s) Performed: Procedure(s) (LRB): DILATATION AND EVACUATION (N/A)  Patient location during evaluation: PACU Anesthesia Type: MAC Level of consciousness: awake and alert Pain management: pain level controlled Vital Signs Assessment: post-procedure vital signs reviewed and stable Respiratory status: spontaneous breathing Cardiovascular status: blood pressure returned to baseline Anesthetic complications: no    Last Vitals:  Filed Vitals:   11/22/15 0945 11/22/15 0950  BP: 94/60 100/68  Pulse: 63   Temp:  36.9 C  Resp: 12     Last Pain: There were no vitals filed for this visit.               Tiajuana Amass

## 2015-11-23 ENCOUNTER — Encounter (HOSPITAL_COMMUNITY): Payer: Self-pay | Admitting: Obstetrics and Gynecology

## 2016-09-12 ENCOUNTER — Ambulatory Visit (HOSPITAL_COMMUNITY)
Admission: EM | Admit: 2016-09-12 | Discharge: 2016-09-12 | Disposition: A | Discharge status: Home / Self Care | Payer: BLUE CROSS/BLUE SHIELD | Attending: Family Medicine | Admitting: Family Medicine

## 2016-09-12 ENCOUNTER — Encounter (HOSPITAL_COMMUNITY): Payer: Self-pay | Admitting: Emergency Medicine

## 2016-09-12 DIAGNOSIS — R531 Weakness: Secondary | ICD-10-CM

## 2016-09-12 DIAGNOSIS — R42 Dizziness and giddiness: Secondary | ICD-10-CM

## 2016-09-12 LAB — POCT I-STAT, CHEM 8
BUN: 8 mg/dL (ref 6–20)
CREATININE: 0.7 mg/dL (ref 0.44–1.00)
Calcium, Ion: 1.25 mmol/L (ref 1.15–1.40)
Chloride: 105 mmol/L (ref 101–111)
Glucose, Bld: 97 mg/dL (ref 65–99)
HEMATOCRIT: 40 % (ref 36.0–46.0)
HEMOGLOBIN: 13.6 g/dL (ref 12.0–15.0)
POTASSIUM: 4.2 mmol/L (ref 3.5–5.1)
Sodium: 140 mmol/L (ref 135–145)
TCO2: 23 mmol/L (ref 0–100)

## 2016-09-12 MED ORDER — MECLIZINE HCL 25 MG PO TABS: 25.0000 mg | ORAL_TABLET | Freq: Three times a day (TID) | ORAL | 0 refills | Status: DC | PRN

## 2016-09-12 NOTE — Discharge Instructions (Signed)
Read your instructions. We will also need to call the telephone numbers listed in your instructions to obtain a primary care provider. He will need additional evaluation, blood work. The dizziness is likely due to an inner ear disorder. Take the meclizine as directed for dizziness. Make sure that you stay well hydrated and drink plenty of fluids.

## 2016-09-12 NOTE — ED Triage Notes (Addendum)
Feeling nauseated , lightheaded, dizziness intermittently for 3 weeks. Intermittent back pain, chronic.  Patient does have runny nose and had a nose bleed last .  Patient reports it is not uncommon for her to have up to 3 nose bleeds a week depending on the weather.  Patient wants hemoglobin checked

## 2016-09-12 NOTE — ED Provider Notes (Signed)
CSN: [id number]     Arrival date & time 09/12/16  1906 History   First MD Initiated Contact with Patient 09/12/16 1950     No chief complaint on file.  (Consider location/radiation/quality/duration/timing/severity/associated sxs/prior Treatment) 30 year old female complaining of dizziness, lightheadedness, feeling of losing and nausea intermittently for the past 3 weeks. She has a history of anemia and is requesting a H&H. LMP was 08/20/2016. None missed and right on time.      Past Medical History:  Diagnosis Date  . Acute urinary retention - immediate PP 04/13/2013  . Anemia   . Anemia of mother in pregnancy, delivered with postpartum condition 04/13/2013  . History of blood transfusion    Past Surgical History:  Procedure Laterality Date  . DILATION AND EVACUATION N/A 11/22/2015   Procedure: DILATATION AND EVACUATION;  Surgeon: Cheri Fowler, MD;  Location: [facility];  Service: Gynecology;  Laterality: N/A;  . LEEP  2009  . THERAPEUTIC ABORTION    . WISDOM TOOTH EXTRACTION  2006   No family history on file. Social History  Substance Use Topics  . Smoking status: Never Smoker  . Smokeless tobacco: Never Used  . Alcohol use No   OB History    Gravida Para Term Preterm AB Living   2 1 1   1 1    SAB TAB Ectopic Multiple Live Births     1     1     Review of Systems  Constitutional: Negative.   HENT: Negative.   Eyes: Negative.   Respiratory: Negative for cough and shortness of breath.   Gastrointestinal: Negative.   Genitourinary: Negative.   Skin: Negative.   Neurological: Positive for dizziness. Negative for syncope and headaches.  Psychiatric/Behavioral: Negative.   All other systems reviewed and are negative.   Allergies  Patient has no known allergies.  Home Medications   Prior to Admission medications   Medication Sig Start Date End Date Taking? Authorizing Provider  Doxylamine-Pyridoxine (DICLEGIS) 10-10 MG TBEC Take 1 tablet by mouth at bedtime as needed  (nausea).    Historical Provider, MD  meclizine (ANTIVERT) 25 MG tablet Take 1 tablet (25 mg total) by mouth 3 (three) times daily as needed for dizziness. Take one half to one tablet 3 times a day when necessary dizziness. I cause drowsiness. 09/12/16   Janne Napoleon, NP  Prenatal Vit-Fe Fumarate-FA (PRENATAL MULTIVITAMIN) TABS Take 1 tablet by mouth every morning.    Historical Provider, MD   Meds Ordered and Administered this Visit  Medications - No data to display  BP 121/78 (BP Location: Right Arm)   Pulse 77   Temp 98.3 F (36.8 C) (Oral)   Resp 18   LMP 08/20/2016   SpO2 99%  No data found.   Physical Exam  Constitutional: She is oriented to person, place, and time. She appears well-developed and well-nourished. No distress.  HENT:  Head: Normocephalic and atraumatic.  Right Ear: External ear normal.  Left Ear: External ear normal.  Mouth/Throat: Oropharynx is clear and moist. No oropharyngeal exudate.  Eyes: Conjunctivae and EOM are normal. Pupils are equal, round, and reactive to light. Right eye exhibits no discharge. Left eye exhibits no discharge.  Testing EOMs caused the patient to have dizziness. No nystagmus.  Neck: Normal range of motion. Neck supple.  Cardiovascular: Normal rate, regular rhythm, normal heart sounds and intact distal pulses.   Pulmonary/Chest: Effort normal and breath sounds normal. No respiratory distress.  Abdominal: Soft. There is no tenderness.  Musculoskeletal:  Normal range of motion. She exhibits no edema or tenderness.  Lymphadenopathy:    She has no cervical adenopathy.  Neurological: She is alert and oriented to person, place, and time. She has normal strength. She displays no tremor. No cranial nerve deficit or sensory deficit. She exhibits normal muscle tone. Coordination and gait normal.  Skin: Skin is warm and dry. No rash noted.  Psychiatric: She has a normal mood and affect.  Nursing note and vitals reviewed.   Urgent Care Course    Clinical Course     Procedures (including critical care time)  Labs Review Labs Reviewed  POCT I-STAT, CHEM 8    Imaging Review No results found.   Visual Acuity Review  Right Eye Distance:   Left Eye Distance:   Bilateral Distance:    Right Eye Near:   Left Eye Near:    Bilateral Near:         MDM   1. Dizziness   2. Weakness    Read your instructions. We will also need to call the telephone numbers listed in your instructions to obtain a primary care provider. He will need additional evaluation, blood work. The dizziness is likely due to an inner ear disorder. Take the meclizine as directed for dizziness. Make sure that you stay well hydrated and drink plenty of fluids. Meds ordered this encounter  Medications  . meclizine (ANTIVERT) 25 MG tablet    Sig: Take 1 tablet (25 mg total) by mouth 3 (three) times daily as needed for dizziness. Take one half to one tablet 3 times a day when necessary dizziness. I cause drowsiness.    Dispense:  20 tablet    Refill:  0    Order Specific Question:   Supervising Provider    Answer:   Carmela Hurt       Janne Napoleon, NP 09/12/16 2029

## 2017-02-04 ENCOUNTER — Inpatient Hospital Stay (HOSPITAL_COMMUNITY): Payer: BLUE CROSS/BLUE SHIELD

## 2017-02-04 ENCOUNTER — Encounter (HOSPITAL_COMMUNITY): Payer: Self-pay | Admitting: *Deleted

## 2017-02-04 ENCOUNTER — Inpatient Hospital Stay (HOSPITAL_COMMUNITY)
Admission: AD | Admit: 2017-02-04 | Discharge: 2017-02-04 | Disposition: A | Discharge status: Home / Self Care | Payer: BLUE CROSS/BLUE SHIELD | Source: Ambulatory Visit | Attending: Obstetrics and Gynecology | Admitting: Obstetrics and Gynecology

## 2017-02-04 DIAGNOSIS — N939 Abnormal uterine and vaginal bleeding, unspecified: Secondary | ICD-10-CM

## 2017-02-04 DIAGNOSIS — Y9301 Activity, walking, marching and hiking: Secondary | ICD-10-CM | POA: Diagnosis not present

## 2017-02-04 DIAGNOSIS — O9A211 Injury, poisoning and certain other consequences of external causes complicating pregnancy, first trimester: Secondary | ICD-10-CM | POA: Insufficient documentation

## 2017-02-04 DIAGNOSIS — O3411 Maternal care for benign tumor of corpus uteri, first trimester: Secondary | ICD-10-CM | POA: Insufficient documentation

## 2017-02-04 DIAGNOSIS — O3680X0 Pregnancy with inconclusive fetal viability, not applicable or unspecified: Secondary | ICD-10-CM | POA: Insufficient documentation

## 2017-02-04 DIAGNOSIS — W010XXA Fall on same level from slipping, tripping and stumbling without subsequent striking against object, initial encounter: Secondary | ICD-10-CM | POA: Diagnosis not present

## 2017-02-04 DIAGNOSIS — Z3A01 Less than 8 weeks gestation of pregnancy: Secondary | ICD-10-CM | POA: Insufficient documentation

## 2017-02-04 DIAGNOSIS — O209 Hemorrhage in early pregnancy, unspecified: Secondary | ICD-10-CM | POA: Diagnosis not present

## 2017-02-04 DIAGNOSIS — O9989 Other specified diseases and conditions complicating pregnancy, childbirth and the puerperium: Secondary | ICD-10-CM

## 2017-02-04 LAB — CBC
HEMATOCRIT: 37.1 % (ref 36.0–46.0)
Hemoglobin: 12.1 g/dL (ref 12.0–15.0)
MCH: 27.9 pg (ref 26.0–34.0)
MCHC: 32.6 g/dL (ref 30.0–36.0)
MCV: 85.5 fL (ref 78.0–100.0)
Platelets: 218 10*3/uL (ref 150–400)
RBC: 4.34 MIL/uL (ref 3.87–5.11)
RDW: 15.6 % — AB (ref 11.5–15.5)
WBC: 5.4 10*3/uL (ref 4.0–10.5)

## 2017-02-04 LAB — POCT PREGNANCY, URINE: PREG TEST UR: POSITIVE — AB

## 2017-02-04 LAB — URINALYSIS, ROUTINE W REFLEX MICROSCOPIC
Bilirubin Urine: NEGATIVE
GLUCOSE, UA: NEGATIVE mg/dL
HGB URINE DIPSTICK: NEGATIVE
Ketones, ur: NEGATIVE mg/dL
NITRITE: NEGATIVE
Protein, ur: NEGATIVE mg/dL
SPECIFIC GRAVITY, URINE: 1.02 (ref 1.005–1.030)
pH: 5 (ref 5.0–8.0)

## 2017-02-04 LAB — ABO/RH: ABO/RH(D): O POS

## 2017-02-04 LAB — WET PREP, GENITAL
Clue Cells Wet Prep HPF POC: NONE SEEN
Sperm: NONE SEEN
TRICH WET PREP: NONE SEEN
Yeast Wet Prep HPF POC: NONE SEEN

## 2017-02-04 LAB — HCG, QUANTITATIVE, PREGNANCY: hCG, Beta Chain, Quant, S: 612 m[IU]/mL — ABNORMAL HIGH (ref <5)

## 2017-02-04 MED ORDER — CYCLOBENZAPRINE HCL 10 MG PO TABS: 10.0000 mg | ORAL_TABLET | Freq: Three times a day (TID) | ORAL | 1 refills | Status: DC | PRN

## 2017-02-04 NOTE — MAU Note (Signed)
Pt presents to MAU after falling down steps on Saturday. Patient c/o tailbone pain and states it is painful to sit down.  Pain 8/10. Patient tried taking Aleve which has not helped. Small amt brown discharge noted since the fall.  LMP 12/30/16.  Patient has first appointment with OBGYN 02/17/17.

## 2017-02-04 NOTE — Discharge Instructions (Signed)
First Trimester of Pregnancy The first trimester of pregnancy is from week 1 until the end of week 13 (months 1 through 3). A week after a sperm fertilizes an egg, the egg will implant on the wall of the uterus. This embryo will begin to develop into a baby. Genes from you and your partner will form the baby. The female genes will determine whether the baby will be a boy or a girl. At 6-8 weeks, the eyes and face will be formed, and the heartbeat can be seen on ultrasound. At the end of 12 weeks, all the baby's organs will be formed. Now that you are pregnant, you will want to do everything you can to have a healthy baby. Two of the most important things are to get good prenatal care and to follow your health care provider's instructions. Prenatal care is all the medical care you receive before the baby's birth. This care will help prevent, find, and treat any problems during the pregnancy and childbirth. Body changes during your first trimester Your body goes through many changes during pregnancy. The changes vary from woman to woman.  You may gain or lose a couple of pounds at first.  You may feel sick to your stomach (nauseous) and you may throw up (vomit). If the vomiting is uncontrollable, call your health care provider.  You may tire easily.  You may develop headaches that can be relieved by medicines. All medicines should be approved by your health care provider.  You may urinate more often. Painful urination may mean you have a bladder infection.  You may develop heartburn as a result of your pregnancy.  You may develop constipation because certain hormones are causing the muscles that push stool through your intestines to slow down.  You may develop hemorrhoids or swollen veins (varicose veins).  Your breasts may begin to grow larger and become tender. Your nipples may stick out more, and the tissue that surrounds them (areola) may become darker.  Your gums may bleed and may be  sensitive to brushing and flossing.  Dark spots or blotches (chloasma, mask of pregnancy) may develop on your face. This will likely fade after the baby is born.  Your menstrual periods will stop.  You may have a loss of appetite.  You may develop cravings for certain kinds of food.  You may have changes in your emotions from day to day, such as being excited to be pregnant or being concerned that something may go wrong with the pregnancy and baby.  You may have more vivid and strange dreams.  You may have changes in your hair. These can include thickening of your hair, rapid growth, and changes in texture. Some women also have hair loss during or after pregnancy, or hair that feels dry or thin. Your hair will most likely return to normal after your baby is born.  What to expect at prenatal visits During a routine prenatal visit:  You will be weighed to make sure you and the baby are growing normally.  Your blood pressure will be taken.  Your abdomen will be measured to track your baby's growth.  The fetal heartbeat will be listened to between weeks 10 and 14 of your pregnancy.  Test results from any previous visits will be discussed.  Your health care provider may ask you:  How you are feeling.  If you are feeling the baby move.  If you have had any abnormal symptoms, such as leaking fluid, bleeding, severe headaches,   or abdominal cramping.  If you are using any tobacco products, including cigarettes, chewing tobacco, and electronic cigarettes.  If you have any questions.  Other tests that may be performed during your first trimester include:  Blood tests to find your blood type and to check for the presence of any previous infections. The tests will also be used to check for low iron levels (anemia) and protein on red blood cells (Rh antibodies). Depending on your risk factors, or if you previously had diabetes during pregnancy, you may have tests to check for high blood  sugar that affects pregnant women (gestational diabetes).  Urine tests to check for infections, diabetes, or protein in the urine.  An ultrasound to confirm the proper growth and development of the baby.  Fetal screens for spinal cord problems (spina bifida) and Down syndrome.  HIV (human immunodeficiency virus) testing. Routine prenatal testing includes screening for HIV, unless you choose not to have this test.  You may need other tests to make sure you and the baby are doing well.  Follow these instructions at home: Medicines  Follow your health care provider's instructions regarding medicine use. Specific medicines may be either safe or unsafe to take during pregnancy.  Take a prenatal vitamin that contains at least 600 micrograms (mcg) of folic acid.  If you develop constipation, try taking a stool softener if your health care provider approves. Eating and drinking  Eat a balanced diet that includes fresh fruits and vegetables, whole grains, good sources of protein such as meat, eggs, or tofu, and low-fat dairy. Your health care provider will help you determine the amount of weight gain that is right for you.  Avoid raw meat and uncooked cheese. These carry germs that can cause birth defects in the baby.  Eating four or five small meals rather than three large meals a day may help relieve nausea and vomiting. If you start to feel nauseous, eating a few soda crackers can be helpful. Drinking liquids between meals, instead of during meals, also seems to help ease nausea and vomiting.  Limit foods that are high in fat and processed sugars, such as fried and sweet foods.  To prevent constipation: ? Eat foods that are high in fiber, such as fresh fruits and vegetables, whole grains, and beans. ? Drink enough fluid to keep your urine clear or pale yellow. Activity  Exercise only as directed by your health care provider. Most women can continue their usual exercise routine during  pregnancy. Try to exercise for 30 minutes at least 5 days a week. Exercising will help you: ? Control your weight. ? Stay in shape. ? Be prepared for labor and delivery.  Experiencing pain or cramping in the lower abdomen or lower back is a good sign that you should stop exercising. Check with your health care provider before continuing with normal exercises.  Try to avoid standing for long periods of time. Move your legs often if you must stand in one place for a long time.  Avoid heavy lifting.  Wear low-heeled shoes and practice good posture.  You may continue to have sex unless your health care provider tells you not to. Relieving pain and discomfort  Wear a good support bra to relieve breast tenderness.  Take warm sitz baths to soothe any pain or discomfort caused by hemorrhoids. Use hemorrhoid cream if your health care provider approves.  Rest with your legs elevated if you have leg cramps or low back pain.  If you develop   varicose veins in your legs, wear support hose. Elevate your feet for 15 minutes, 3-4 times a day. Limit salt in your diet. Prenatal care  Schedule your prenatal visits by the twelfth week of pregnancy. They are usually scheduled monthly at first, then more often in the last 2 months before delivery.  Write down your questions. Take them to your prenatal visits.  Keep all your prenatal visits as told by your health care provider. This is important. Safety  Wear your seat belt at all times when driving.  Make a list of emergency phone numbers, including numbers for family, friends, the hospital, and police and fire departments. General instructions  Ask your health care provider for a referral to a local prenatal education class. Begin classes no later than the beginning of month 6 of your pregnancy.  Ask for help if you have counseling or nutritional needs during pregnancy. Your health care provider can offer advice or refer you to specialists for help  with various needs.  Do not use hot tubs, steam rooms, or saunas.  Do not douche or use tampons or scented sanitary pads.  Do not cross your legs for long periods of time.  Avoid cat litter boxes and soil used by cats. These carry germs that can cause birth defects in the baby and possibly loss of the fetus by miscarriage or stillbirth.  Avoid all smoking, herbs, alcohol, and medicines not prescribed by your health care provider. Chemicals in these products affect the formation and growth of the baby.  Do not use any products that contain nicotine or tobacco, such as cigarettes and e-cigarettes. If you need help quitting, ask your health care provider. You may receive counseling support and other resources to help you quit.  Schedule a dentist appointment. At home, brush your teeth with a soft toothbrush and be gentle when you floss. Contact a health care provider if:  You have dizziness.  You have mild pelvic cramps, pelvic pressure, or nagging pain in the abdominal area.  You have persistent nausea, vomiting, or diarrhea.  You have a bad smelling vaginal discharge.  You have pain when you urinate.  You notice increased swelling in your face, hands, legs, or ankles.  You are exposed to fifth disease or chickenpox.  You are exposed to German measles (rubella) and have never had it. Get help right away if:  You have a fever.  You are leaking fluid from your vagina.  You have spotting or bleeding from your vagina.  You have severe abdominal cramping or pain.  You have rapid weight gain or loss.  You vomit blood or material that looks like coffee grounds.  You develop a severe headache.  You have shortness of breath.  You have any kind of trauma, such as from a fall or a car accident. Summary  The first trimester of pregnancy is from week 1 until the end of week 13 (months 1 through 3).  Your body goes through many changes during pregnancy. The changes vary from  woman to woman.  You will have routine prenatal visits. During those visits, your health care provider will examine you, discuss any test results you may have, and talk with you about how you are feeling. This information is not intended to replace advice given to you by your health care provider. Make sure you discuss any questions you have with your health care provider. Document Released: 09/24/2001 Document Revised: 09/11/2016 Document Reviewed: 09/11/2016 [employer] Interactive Patient Education  2017 [employer]  

## 2017-02-04 NOTE — MAU Provider Note (Signed)
Ashley Shaw is a 31 year old [id number] At [redacted]w[redacted]d by certain LMP here with complaints of pain in her tailbone since a fall on Saturday. She denies cramping, but does endorse occasional brown discharge today when she wipes. She did not hit her abdomen when she fell.   History     CSN: [id number]  Arrival date and time: 02/04/17 1497   First Provider Initiated Contact with Patient 02/04/17 1028      Chief Complaint  Patient presents with  . Fall   Fall  The accident occurred 3 to 5 days ago. The fall occurred while walking. Distance fallen: slipped down a set of stairs. She landed on hard floor. There was no blood loss. The point of impact was the buttocks. The pain is present in the buttocks. The pain is at a severity of 8/10. The pain is moderate. The symptoms are aggravated by ambulation, movement and pressure on injury. Pertinent negatives include no abdominal pain. She has tried nothing for the symptoms.    OB History    Gravida Para Term Preterm AB Living   3 1 1  0 1 1   SAB TAB Ectopic Multiple Live Births   1 0 0 0 1      Past Medical History:  Diagnosis Date  . Acute urinary retention - immediate PP 04/13/2013  . Anemia   . Anemia of mother in pregnancy, delivered with postpartum condition 04/13/2013  . History of blood transfusion     Past Surgical History:  Procedure Laterality Date  . DILATION AND EVACUATION N/A 11/22/2015   Procedure: DILATATION AND EVACUATION;  Surgeon: Cheri Fowler, MD;  Location: [facility];  Service: Gynecology;  Laterality: N/A;  . LEEP  2009  . THERAPEUTIC ABORTION    . [place] EXTRACTION  2006    History reviewed. No pertinent family history.  Social History  Substance Use Topics  . Smoking status: Never Smoker  . Smokeless tobacco: Never Used  . Alcohol use Yes     Comment: occasionally    Allergies: No Known Allergies  Prescriptions Prior to Admission  Medication Sig Dispense Refill Last Dose  . naproxen sodium (ANAPROX) 220 MG  tablet Take 440 mg by mouth 2 (two) times daily as needed (pain).   Past Week at Unknown time  . meclizine (ANTIVERT) 25 MG tablet Take 1 tablet (25 mg total) by mouth 3 (three) times daily as needed for dizziness. Take one half to one tablet 3 times a day when necessary dizziness. I cause drowsiness. (Patient not taking: Reported on 02/04/2017) 20 tablet 0 Not Taking at Unknown time    Review of Systems  Eyes: Negative.   Respiratory: Negative.   Cardiovascular: Negative.   Gastrointestinal: Negative for abdominal pain.  Genitourinary: Negative.   Musculoskeletal: Positive for back pain.  Skin: Negative.    Physical Exam   Blood pressure 118/84, pulse 83, temperature 97.6 F (36.4 C), temperature source Axillary, resp. rate 17, height 5\' 9"  (1.753 m), weight 63.7 kg (140 lb 6.4 oz), last menstrual period 12/30/2016, SpO2 100 %, unknown if currently breastfeeding.  Physical Exam  Constitutional: She is oriented to person, place, and time. She appears well-developed.  HENT:  Head: Normocephalic.  Eyes: Pupils are equal, round, and reactive to light.  Neck: Normal range of motion.  Respiratory: Effort normal.  GI: Soft.  Genitourinary:  Genitourinary Comments: NEFG; no lesions on vaginal walls. No blood in the vagina, no lesions on cervix, no CMT, no suprapubic tenderness  no adnexal tenderness.   Musculoskeletal: Normal range of motion.  Neurological: She is alert and oriented to person, place, and time.  Skin: Skin is warm and dry.  Psychiatric: She has a normal mood and affect.    MAU Course  Procedures  MDM -CBC, ABO -beta: 651 -wet prep: negative -GC CT pending US Ob Transvaginal  Result Date: 02/04/2017 CLINICAL DATA:  Vaginal bleeding, pregnant, EGA [redacted] weeks 1 day by LMP of 12/30/2016 EXAM: TRANSVAGINAL OB ULTRASOUND TECHNIQUE: Transvaginal ultrasound was performed for complete evaluation of the gestation as well as the maternal uterus, adnexal regions, and pelvic  cul-de-sac. COMPARISON:  None for this gestation FINDINGS: Intrauterine gestational sac: Absent Yolk sac:  N/A Embryo:  N/A Cardiac Activity: N/A Heart Rate: N/A bpm Subchorionic hemorrhage:  N/A Maternal uterus/adnexae: No gestational sac or endometrial fluid identified. Probable small uterine leiomyomata on LEFT 2.5 x 2.1 x 1.3 cm and at mid anterior uterus 9 x 9 x 6 mm. RIGHT ovary normal size and morphology, 2.4 x 2.5 x 1.6 cm. LEFT ovary normal size and morphology, 3.1 x 1.4 x 2.2 cm. No free pelvic fluid or adnexal masses. IMPRESSION: Two small uterine leiomyomata. No intrauterine gestation identified. Findings are compatible with pregnancy of unknown location. Differential diagnosis includes early intrauterine pregnancy too early to visualize, spontaneous abortion, and ectopic pregnancy. Serial quantitative beta HCG and or followup ultrasound recommended to definitively exclude ectopic pregnancy. Electronically Signed   By: Lavonia Dana M.D.   On: 02/04/2017 11:57     Assessment and Plan   1. Pregnancy of unknown anatomic location   2. Vaginal bleeding    3. Discussed [place] and beta results with Dr. Gaetano Net, who recommends follow-up beta at his office on Thursday. Patient to call and make appointment for Thursday. I explained in detail what a pregnancy of unknown anatomic location means, reviewed her lab results, and I gave her strict ectopic precautions. Patient verbalized understanding; all questions answered.    Mervyn Skeeters Kooistra CNM 02/04/2017, 1:09 PM

## 2017-02-05 LAB — GC/CHLAMYDIA PROBE AMP (~~LOC~~) NOT AT ARMC
CHLAMYDIA, DNA PROBE: NEGATIVE
NEISSERIA GONORRHEA: NEGATIVE

## 2017-02-09 ENCOUNTER — Inpatient Hospital Stay (HOSPITAL_COMMUNITY)
Admission: AD | Admit: 2017-02-09 | Discharge: 2017-02-09 | Disposition: A | Discharge status: Home / Self Care | Payer: BLUE CROSS/BLUE SHIELD | Source: Ambulatory Visit | Attending: Obstetrics and Gynecology | Admitting: Obstetrics and Gynecology

## 2017-02-09 ENCOUNTER — Encounter (HOSPITAL_COMMUNITY): Payer: Self-pay

## 2017-02-09 ENCOUNTER — Inpatient Hospital Stay (HOSPITAL_COMMUNITY): Payer: BLUE CROSS/BLUE SHIELD

## 2017-02-09 DIAGNOSIS — O26891 Other specified pregnancy related conditions, first trimester: Secondary | ICD-10-CM | POA: Diagnosis not present

## 2017-02-09 DIAGNOSIS — O209 Hemorrhage in early pregnancy, unspecified: Secondary | ICD-10-CM | POA: Diagnosis not present

## 2017-02-09 DIAGNOSIS — R109 Unspecified abdominal pain: Secondary | ICD-10-CM

## 2017-02-09 DIAGNOSIS — O039 Complete or unspecified spontaneous abortion without complication: Secondary | ICD-10-CM | POA: Diagnosis not present

## 2017-02-09 LAB — CBC
HCT: 34.8 % — ABNORMAL LOW (ref 36.0–46.0)
HEMOGLOBIN: 11.4 g/dL — AB (ref 12.0–15.0)
MCH: 28.3 pg (ref 26.0–34.0)
MCHC: 32.8 g/dL (ref 30.0–36.0)
MCV: 86.4 fL (ref 78.0–100.0)
PLATELETS: 206 10*3/uL (ref 150–400)
RBC: 4.03 MIL/uL (ref 3.87–5.11)
RDW: 15.4 % (ref 11.5–15.5)
WBC: 5.7 10*3/uL (ref 4.0–10.5)

## 2017-02-09 LAB — URINALYSIS, ROUTINE W REFLEX MICROSCOPIC
Bacteria, UA: NONE SEEN
Bilirubin Urine: NEGATIVE
Glucose, UA: NEGATIVE mg/dL
KETONES UR: NEGATIVE mg/dL
Nitrite: NEGATIVE
Protein, ur: 100 mg/dL — AB
Specific Gravity, Urine: 1.025 (ref 1.005–1.030)
WBC, UA: NONE SEEN WBC/hpf (ref 0–5)
pH: 8 (ref 5.0–8.0)

## 2017-02-09 LAB — TYPE AND SCREEN
ABO/RH(D): O POS
Antibody Screen: NEGATIVE

## 2017-02-09 LAB — HCG, QUANTITATIVE, PREGNANCY: HCG, BETA CHAIN, QUANT, S: 353 m[IU]/mL — AB (ref <5)

## 2017-02-09 MED ORDER — IBUPROFEN 600 MG PO TABS: 600.0000 mg | ORAL_TABLET | Freq: Four times a day (QID) | ORAL | 1 refills | Status: DC | PRN

## 2017-02-09 NOTE — MAU Note (Signed)
Pt has had vaginal bleeding with clots today. She was walking around [employer] and had abdominal pain on the right side 9/10 and started bleeding heavier.

## 2017-02-09 NOTE — MAU Provider Note (Signed)
History    First Provider Initiated Contact with Patient 02/09/17 1932      Chief Complaint:  Vaginal Bleeding   Ashley Shaw is  31 y.o. G3P1011 Patient's last menstrual period was 12/30/2016 (exact date).. Patient is here for follow up of quantitative HCG and ongoing surveillance of pregnancy status.   She is [redacted]w[redacted]d weeks gestation  by LMP.  Previous US showed nothing in the uterus or adnexae.   Since her last visit, the patient is with new complaint.     ROS Abdomin Pain: Mild cramping Vaginal bleeding: lighter than period.   Passage of clots or tissue: Denies Dizziness: Denies  O POS  Her previous Quantitative HCG values are: 4/24: 612 4/26: 670 (at office per pt)   Physical Exam   Patient Vitals for the past 24 hrs:  BP Temp Pulse Resp  02/09/17 1711 120/71 99.6 F (37.6 C) 87 16   Constitutional: Well-nourished female in no apparent distress. No pallor Neuro: Alert and oriented 4 Cardiovascular: Normal rate Respiratory: Normal effort and rate Abdomen: Soft, nontender Gynecological Exam: exam declined by the patient  Labs: Results for orders placed or performed during the hospital encounter of 02/09/17 (from the past 24 hour(s))  Urinalysis, Routine w reflex microscopic   Collection Time: 02/09/17  5:02 PM  Result Value Ref Range   Color, Urine YELLOW YELLOW   APPearance CLOUDY (A) CLEAR   Specific Gravity, Urine 1.025 1.005 - 1.030   pH 8.0 5.0 - 8.0   Glucose, UA NEGATIVE NEGATIVE mg/dL   Hgb urine dipstick LARGE (A) NEGATIVE   Bilirubin Urine NEGATIVE NEGATIVE   Ketones, ur NEGATIVE NEGATIVE mg/dL   Protein, ur 100 (A) NEGATIVE mg/dL   Nitrite NEGATIVE NEGATIVE   Leukocytes, UA TRACE (A) NEGATIVE   RBC / HPF TOO NUMEROUS TO COUNT 0 - 5 RBC/hpf   WBC, UA NONE SEEN 0 - 5 WBC/hpf   Bacteria, UA NONE SEEN NONE SEEN   Squamous Epithelial / LPF 0-5 (A) NONE SEEN   Mucous PRESENT   CBC   Collection Time: 02/09/17  5:32 PM  Result Value Ref Range    WBC 5.7 4.0 - 10.5 K/uL   RBC 4.03 3.87 - 5.11 MIL/uL   Hemoglobin 11.4 (L) 12.0 - 15.0 g/dL   HCT 34.8 (L) 36.0 - 46.0 %   MCV 86.4 78.0 - 100.0 fL   MCH 28.3 26.0 - 34.0 pg   MCHC 32.8 30.0 - 36.0 g/dL   RDW 15.4 11.5 - 15.5 %   Platelets 206 150 - 400 K/uL  hCG, quantitative, pregnancy   Collection Time: 02/09/17  5:32 PM  Result Value Ref Range   hCG, Beta Chain, Quant, S 353 (H) <5 mIU/mL  Type and screen   Collection Time: 02/09/17  5:32 PM  Result Value Ref Range   ABO/RH(D) O POS    Antibody Screen NEG    Sample Expiration 02/12/2017     Ultrasound Studies:   US Ob Transvaginal  Result Date: 02/09/2017 CLINICAL DATA:  Vaginal bleeding with clots. Right-sided abdominal pain. Patient is reportedly pregnant, 5 weeks and 6 days based on last menstrual period. EXAM: TRANSVAGINAL OB ULTRASOUND TECHNIQUE: Transvaginal ultrasound was performed for complete evaluation of the gestation as well as the maternal uterus, adnexal regions, and pelvic cul-de-sac. COMPARISON:  None. FINDINGS: Intrauterine gestational sac: None Yolk sac:  Not Visualized. Embryo:  Not Visualized. Maternal uterus/adnexae: Uterus is normal in size and echogenicity. There is a subserosal fibroid on  the left measuring 2.1 x 1.4 x 2.0 cm. No other uterine masses. Endometrium is heterogeneous measuring approximately 11 mm in thickness. No endometrial fluid. Normal ovaries. No adnexal masses. No pelvic free fluid. IMPRESSION: 1. No evidence of an intrauterine pregnancy. Heterogeneous echogenicity of the endometrium, which is normal in overall thickness. Findings may reflect a completed miscarriage, or a very early pregnancy. An early ectopic pregnancy is not excluded. Recommend follow-up serial beta HCG levels and repeat ultrasound is beta HCG levels are rising. 2. Subserosal uterine fibroid. 3. Normal ovaries and adnexa. Electronically Signed   By: Lajean Manes M.D.   On: 02/09/2017 18:20   US Ob Transvaginal  Result  Date: 02/04/2017 CLINICAL DATA:  Vaginal bleeding, pregnant, EGA [redacted] weeks 1 day by LMP of 12/30/2016 EXAM: TRANSVAGINAL OB ULTRASOUND TECHNIQUE: Transvaginal ultrasound was performed for complete evaluation of the gestation as well as the maternal uterus, adnexal regions, and pelvic cul-de-sac. COMPARISON:  None for this gestation FINDINGS: Intrauterine gestational sac: Absent Yolk sac:  N/A Embryo:  N/A Cardiac Activity: N/A Heart Rate: N/A bpm Subchorionic hemorrhage:  N/A Maternal uterus/adnexae: No gestational sac or endometrial fluid identified. Probable small uterine leiomyomata on LEFT 2.5 x 2.1 x 1.3 cm and at mid anterior uterus 9 x 9 x 6 mm. RIGHT ovary normal size and morphology, 2.4 x 2.5 x 1.6 cm. LEFT ovary normal size and morphology, 3.1 x 1.4 x 2.2 cm. No free pelvic fluid or adnexal masses. IMPRESSION: Two small uterine leiomyomata. No intrauterine gestation identified. Findings are compatible with pregnancy of unknown location. Differential diagnosis includes early intrauterine pregnancy too early to visualize, spontaneous abortion, and ectopic pregnancy. Serial quantitative beta HCG and or followup ultrasound recommended to definitively exclude ectopic pregnancy. Electronically Signed   By: Lavonia Dana M.D.   On: 02/04/2017 11:57    MAU course/MDM: Quantitative hCG, [place], CBC ordered  Pain and bleeding in early pregnancy with 50 % drop in Quant C/W failing pregnancy, but hemodynamically stable.  Discussed Hx, labs, exam w/ Dr. Matthew Saras. Agrees w/ POC. New orders: F/U quants in office tomorrow 4/30 or next day.   Assessment: Failing pregnancy. IUP not confirmed.  Plan: Discharge home in stable condition. SAB and ectopic precautions Rx IBU Support given.  Follow-up Information    TOMBLIN Marjean Donna, MD Follow up.   Specialty:  Obstetrics and Gynecology Why:  call to schedule follow-up appointment in 2 days Contact information: [place]  [address] [place] Follow up.   Why:  in pregnancy emergencies Contact information: [address] [id number] [place] [phone]         Allergies as of 02/09/2017   No Known Allergies     Medication List    TAKE these medications   cyclobenzaprine 10 MG tablet Commonly known as:  FLEXERIL Take 1 tablet (10 mg total) by mouth every 8 (eight) hours as needed for muscle spasms.   ibuprofen 600 MG tablet Commonly known as:  ADVIL,MOTRIN Take 1 tablet (600 mg total) by mouth every 6 (six) hours as needed.       Manya Silvas, CNM 02/09/2017, 7:33 PM  2/3

## 2017-02-09 NOTE — Discharge Instructions (Signed)

## 2017-09-08 ENCOUNTER — Other Ambulatory Visit: Payer: Self-pay

## 2017-09-08 ENCOUNTER — Encounter (HOSPITAL_COMMUNITY): Payer: Self-pay | Admitting: *Deleted

## 2017-09-08 ENCOUNTER — Inpatient Hospital Stay (HOSPITAL_COMMUNITY)
Admission: AD | Admit: 2017-09-08 | Discharge: 2017-09-08 | Discharge status: Against Medical Advice | Payer: BLUE CROSS/BLUE SHIELD | Source: Ambulatory Visit | Attending: Obstetrics and Gynecology | Admitting: Obstetrics and Gynecology

## 2017-09-08 DIAGNOSIS — O209 Hemorrhage in early pregnancy, unspecified: Secondary | ICD-10-CM | POA: Insufficient documentation

## 2017-09-08 DIAGNOSIS — Z3A01 Less than 8 weeks gestation of pregnancy: Secondary | ICD-10-CM | POA: Insufficient documentation

## 2017-09-08 LAB — URINALYSIS, ROUTINE W REFLEX MICROSCOPIC
Bilirubin Urine: NEGATIVE
GLUCOSE, UA: NEGATIVE mg/dL
KETONES UR: NEGATIVE mg/dL
Leukocytes, UA: NEGATIVE
Nitrite: NEGATIVE
PROTEIN: NEGATIVE mg/dL
Specific Gravity, Urine: 1.006 (ref 1.005–1.030)
pH: 5 (ref 5.0–8.0)

## 2017-09-08 LAB — POCT PREGNANCY, URINE: Preg Test, Ur: POSITIVE — AB

## 2017-09-08 NOTE — MAU Note (Signed)
Not in lobby

## 2017-09-08 NOTE — MAU Note (Signed)
+  HPT 2 wks ago.   Started bleeding yesterday. Started light, has picked up, now clotting and cramping.  Started feeling nauseous and faint, so that is why she came in.

## 2017-10-02 ENCOUNTER — Inpatient Hospital Stay (HOSPITAL_COMMUNITY)
Admission: AD | Admit: 2017-10-02 | Discharge: 2017-10-02 | Disposition: A | Discharge status: Home / Self Care | Payer: Self-pay | Source: Ambulatory Visit | Attending: Obstetrics and Gynecology | Admitting: Obstetrics and Gynecology

## 2017-10-02 ENCOUNTER — Encounter (HOSPITAL_COMMUNITY): Payer: Self-pay | Admitting: *Deleted

## 2017-10-02 ENCOUNTER — Inpatient Hospital Stay (HOSPITAL_COMMUNITY): Payer: Self-pay

## 2017-10-02 DIAGNOSIS — Z79899 Other long term (current) drug therapy: Secondary | ICD-10-CM | POA: Insufficient documentation

## 2017-10-02 DIAGNOSIS — O209 Hemorrhage in early pregnancy, unspecified: Secondary | ICD-10-CM

## 2017-10-02 DIAGNOSIS — O039 Complete or unspecified spontaneous abortion without complication: Secondary | ICD-10-CM | POA: Insufficient documentation

## 2017-10-02 LAB — URINALYSIS, ROUTINE W REFLEX MICROSCOPIC
Bilirubin Urine: NEGATIVE
Glucose, UA: NEGATIVE mg/dL
Ketones, ur: NEGATIVE mg/dL
Leukocytes, UA: NEGATIVE
Nitrite: NEGATIVE
PH: 6 (ref 5.0–8.0)
Protein, ur: NEGATIVE mg/dL
SPECIFIC GRAVITY, URINE: 1.004 — AB (ref 1.005–1.030)

## 2017-10-02 LAB — CBC
HCT: 32.6 % — ABNORMAL LOW (ref 36.0–46.0)
Hemoglobin: 11 g/dL — ABNORMAL LOW (ref 12.0–15.0)
MCH: 30.3 pg (ref 26.0–34.0)
MCHC: 33.7 g/dL (ref 30.0–36.0)
MCV: 89.8 fL (ref 78.0–100.0)
PLATELETS: 220 10*3/uL (ref 150–400)
RBC: 3.63 MIL/uL — ABNORMAL LOW (ref 3.87–5.11)
RDW: 14.4 % (ref 11.5–15.5)
WBC: 5.7 10*3/uL (ref 4.0–10.5)

## 2017-10-02 LAB — HCG, QUANTITATIVE, PREGNANCY: hCG, Beta Chain, Quant, S: 12 m[IU]/mL — ABNORMAL HIGH (ref <5)

## 2017-10-02 NOTE — MAU Provider Note (Signed)
Chief Complaint: Vaginal Bleeding   First Provider Initiated Contact with Patient 10/02/17 2152      SUBJECTIVE HPI: Ashley Shaw is a 31 y.o. [id number] at [redacted]w[redacted]d by LMP who presents to maternity admissions reporting positive pregnancy test in mid November, onset of light bleeding the day after Thanksgiving, and daily bleeding since then. The bleeding increased slightly, requiring a pad change every hour but the pad is not fully soaked in the last 24 hours. And the bleeding is associated with some back pain in the last 3 days but was painless before now.  She has not tried any treatments. She assumed the bleeding was a miscarriage because this is her third miscarriage since her child was born 4 years ago.  She denies any other associated symptoms. She denies vaginal itching/burning, urinary symptoms, h/a, dizziness, n/v, or fever/chills.     HPI  Past Medical History:  Diagnosis Date  . Acute urinary retention - immediate PP 04/13/2013  . Anemia   . Anemia of mother in pregnancy, delivered with postpartum condition 04/13/2013  . History of blood transfusion    Past Surgical History:  Procedure Laterality Date  . DILATION AND EVACUATION N/A 11/22/2015   Procedure: DILATATION AND EVACUATION;  Surgeon: Cheri Fowler, MD;  Location: [facility];  Service: Gynecology;  Laterality: N/A;  . LEEP  2009  . THERAPEUTIC ABORTION    . WISDOM TOOTH EXTRACTION  2006   Social History   Socioeconomic History  . Marital status: Single    Spouse name: Not on file  . Number of children: Not on file  . Years of education: Not on file  . Highest education level: Not on file  Social Needs  . Financial resource strain: Not on file  . Food insecurity - worry: Not on file  . Food insecurity - inability: Not on file  . Transportation needs - medical: Not on file  . Transportation needs - non-medical: Not on file  Occupational History  . Not on file  Tobacco Use  . Smoking status: Never Smoker  . Smokeless  tobacco: Never Used  Substance and Sexual Activity  . Alcohol use: Yes    Comment: occasionally  . Drug use: No  . Sexual activity: Yes    Birth control/protection: None  Other Topics Concern  . Not on file  Social History Narrative  . Not on file   No current facility-administered medications on file prior to encounter.    Current Outpatient Medications on File Prior to Encounter  Medication Sig Dispense Refill  . cyclobenzaprine (FLEXERIL) 10 MG tablet Take 1 tablet (10 mg total) by mouth every 8 (eight) hours as needed for muscle spasms. 30 tablet 1  . ibuprofen (ADVIL,MOTRIN) 600 MG tablet Take 1 tablet (600 mg total) by mouth every 6 (six) hours as needed. 30 tablet 1   No Known Allergies  ROS:  Review of Systems  Constitutional: Negative for chills, fatigue and fever.  Respiratory: Negative for shortness of breath.   Cardiovascular: Negative for chest pain.  Gastrointestinal: Negative for abdominal pain, nausea and vomiting.  Genitourinary: Positive for pelvic pain and vaginal bleeding. Negative for difficulty urinating, dysuria, flank pain, vaginal discharge and vaginal pain.  Neurological: Negative for dizziness and headaches.  Psychiatric/Behavioral: Negative.      I have reviewed patient's Past Medical Hx, Surgical Hx, Family Hx, Social Hx, medications and allergies.   Physical Exam   Patient Vitals for the past 24 hrs:  BP Temp Temp src Pulse  Resp Height Weight  10/02/17 1930 128/77 98.9 F (37.2 C) Oral 68 20 5\' 9"  (1.753 m) 138 lb (62.6 kg)   Constitutional: Well-developed, well-nourished female in no acute distress.  Cardiovascular: normal rate Respiratory: normal effort GI: Abd soft, non-tender. Pos BS x 4 MS: Extremities nontender, no edema, normal ROM Neurologic: Alert and oriented x 4.  GU: Neg CVAT.  PELVIC EXAM: Deferred, pad evaluated and less than 1/2 soaked with light red bleeding in 2 hours in MAU.    LAB RESULTS Results for orders placed  or performed during the hospital encounter of 10/02/17 (from the past 24 hour(s))  Urinalysis, Routine w reflex microscopic     Status: Abnormal   Collection Time: 10/02/17  7:32 PM  Result Value Ref Range   Color, Urine YELLOW YELLOW   APPearance CLEAR CLEAR   Specific Gravity, Urine 1.004 (L) 1.005 - 1.030   pH 6.0 5.0 - 8.0   Glucose, UA NEGATIVE NEGATIVE mg/dL   Hgb urine dipstick LARGE (A) NEGATIVE   Bilirubin Urine NEGATIVE NEGATIVE   Ketones, ur NEGATIVE NEGATIVE mg/dL   Protein, ur NEGATIVE NEGATIVE mg/dL   Nitrite NEGATIVE NEGATIVE   Leukocytes, UA NEGATIVE NEGATIVE   RBC / HPF TOO NUMEROUS TO COUNT 0 - 5 RBC/hpf   WBC, UA 6-30 0 - 5 WBC/hpf   Bacteria, UA RARE (A) NONE SEEN   Squamous Epithelial / LPF 0-5 (A) NONE SEEN  CBC     Status: Abnormal   Collection Time: 10/02/17  7:36 PM  Result Value Ref Range   WBC 5.7 4.0 - 10.5 K/uL   RBC 3.63 (L) 3.87 - 5.11 MIL/uL   Hemoglobin 11.0 (L) 12.0 - 15.0 g/dL   HCT 32.6 (L) 36.0 - 46.0 %   MCV 89.8 78.0 - 100.0 fL   MCH 30.3 26.0 - 34.0 pg   MCHC 33.7 30.0 - 36.0 g/dL   RDW 14.4 11.5 - 15.5 %   Platelets 220 150 - 400 K/uL  hCG, quantitative, pregnancy     Status: Abnormal   Collection Time: 10/02/17  7:36 PM  Result Value Ref Range   hCG, Beta Chain, Quant, S 12 (H) <5 mIU/mL    --/--/O POS (04/29 1732)  IMAGING US Ob Comp Less 14 Wks  Result Date: 10/02/2017 CLINICAL DATA:  Vaginal bleeding and cramping EXAM: OBSTETRIC <14 WK [place] AND TRANSVAGINAL OB US TECHNIQUE: Both transabdominal and transvaginal ultrasound examinations were performed for complete evaluation of the gestation as well as the maternal uterus, adnexal regions, and pelvic cul-de-sac. Transvaginal technique was performed to assess early pregnancy. COMPARISON:  None. FINDINGS: Intrauterine gestational sac: Not seen Yolk sac:  Not seen. Embryo:  Not seen Cardiac Activity: Not seen Subchorionic hemorrhage:  None visualized. Maternal uterus/adnexae: Ovaries are  within normal limits. The right ovary measures 3.6 x 1.3 x 1.8 cm. The left ovary measures 3.3 x 1.5 x 1.8 cm. Fibroid in the left anterior uterus measuring 2.5 x 2.1 x 1.9 cm. No significant free fluid IMPRESSION: 1. No intrauterine pregnancy is visualized. Findings consistent with pregnancy of unknown location which may be secondary to IUP too early to visualize, occult ectopic, or recent failed pregnancy. Recommend trending of HCG with follow-up ultrasound as indicated. 2. Fibroid in the uterus. Electronically Signed   By: Donavan Foil M.D.   On: 10/02/2017 21:03   US Ob Transvaginal  Result Date: 10/02/2017 CLINICAL DATA:  Vaginal bleeding and cramping EXAM: OBSTETRIC <14 WK [place] AND TRANSVAGINAL OB US  TECHNIQUE: Both transabdominal and transvaginal ultrasound examinations were performed for complete evaluation of the gestation as well as the maternal uterus, adnexal regions, and pelvic cul-de-sac. Transvaginal technique was performed to assess early pregnancy. COMPARISON:  None. FINDINGS: Intrauterine gestational sac: Not seen Yolk sac:  Not seen. Embryo:  Not seen Cardiac Activity: Not seen Subchorionic hemorrhage:  None visualized. Maternal uterus/adnexae: Ovaries are within normal limits. The right ovary measures 3.6 x 1.3 x 1.8 cm. The left ovary measures 3.3 x 1.5 x 1.8 cm. Fibroid in the left anterior uterus measuring 2.5 x 2.1 x 1.9 cm. No significant free fluid IMPRESSION: 1. No intrauterine pregnancy is visualized. Findings consistent with pregnancy of unknown location which may be secondary to IUP too early to visualize, occult ectopic, or recent failed pregnancy. Recommend trending of HCG with follow-up ultrasound as indicated. 2. Fibroid in the uterus. Electronically Signed   By: Donavan Foil M.D.   On: 10/02/2017 21:03    MAU Management/MDM: Ordered labs [place] and reviewed results.  With pt report of positive pregnancy test in November and bleeding since then, and quant hcg of 12 today,  likely SAB, much less likely ectopic pregnancy or normal IUP.  Consult Dr Gaetano Net with assessment and findings.  Pt to call office and follow up in 1-2 weeks in the office.  Discussed importance of following hcg levels down.  Pt discharged with strict miscarriage/bleeding/pain in pregnancy precautions.  ASSESSMENT 1. SAB (spontaneous abortion)   2. Vaginal bleeding in pregnancy, first trimester     PLAN Discharge home Allergies as of 10/02/2017   No Known Allergies     Medication List    TAKE these medications   cyclobenzaprine 10 MG tablet Commonly known as:  FLEXERIL Take 1 tablet (10 mg total) by mouth every 8 (eight) hours as needed for muscle spasms.   ibuprofen 600 MG tablet Commonly known as:  ADVIL,MOTRIN Take 1 tablet (600 mg total) by mouth every 6 (six) hours as needed.      Follow-up Information    [place] Follow up.   Why:  Call the office to schedule visit in 1-2 weeks for repeat labs/follow up visit for miscarriage. Return to MAU as needed for emergencies. Contact information: [place] [address] [phone]           Fatima Blank Certified Nurse-Midwife 10/02/2017  10:22 PM

## 2017-10-02 NOTE — MAU Note (Signed)
PT  SAYS SHE HAS BEEN BLEEDING - ON A PAD  SINCE   DAY AFTER THANKSGIVING.    FEELS  SOME CRAMPING IN LOWER BACK  AND PASSING  CLOTS -   DIME  SIZE.       MON AFTER THANKSGIVING  SHE  CAME HERE-  WAS TRIAGED  AND THEN LEFT .    PAD ON IN TRIAGE -   LONG MINIPAD -  SMALL AMT   RED.

## 2017-10-04 LAB — CULTURE, OB URINE

## 2018-02-02 LAB — OB RESULTS CONSOLE RPR: RPR: NONREACTIVE

## 2018-02-02 LAB — OB RESULTS CONSOLE HIV ANTIBODY (ROUTINE TESTING): HIV: NONREACTIVE

## 2018-02-02 LAB — OB RESULTS CONSOLE RUBELLA ANTIBODY, IGM: Rubella: IMMUNE

## 2018-02-02 LAB — OB RESULTS CONSOLE HEPATITIS B SURFACE ANTIGEN: Hepatitis B Surface Ag: NEGATIVE

## 2018-02-11 LAB — OB RESULTS CONSOLE GC/CHLAMYDIA
Chlamydia: NEGATIVE
Gonorrhea: NEGATIVE

## 2018-05-07 ENCOUNTER — Other Ambulatory Visit: Payer: Self-pay

## 2018-05-07 ENCOUNTER — Observation Stay (HOSPITAL_COMMUNITY)
Admission: AD | Admit: 2018-05-07 | Discharge: 2018-05-07 | Disposition: A | Discharge status: Home / Self Care | Payer: 59 | Source: Ambulatory Visit | Attending: Obstetrics and Gynecology | Admitting: Obstetrics and Gynecology

## 2018-05-07 ENCOUNTER — Encounter (HOSPITAL_COMMUNITY): Payer: Self-pay

## 2018-05-07 DIAGNOSIS — O99019 Anemia complicating pregnancy, unspecified trimester: Secondary | ICD-10-CM | POA: Diagnosis present

## 2018-05-07 DIAGNOSIS — Z3A00 Weeks of gestation of pregnancy not specified: Secondary | ICD-10-CM | POA: Insufficient documentation

## 2018-05-07 LAB — CBC
HCT: 17.3 % — ABNORMAL LOW (ref 36.0–46.0)
HEMOGLOBIN: 6.2 g/dL — AB (ref 12.0–15.0)
MCH: 33.9 pg (ref 26.0–34.0)
MCHC: 35.8 g/dL (ref 30.0–36.0)
MCV: 94.5 fL (ref 78.0–100.0)
Platelets: 120 10*3/uL — ABNORMAL LOW (ref 150–400)
RBC: 1.83 MIL/uL — ABNORMAL LOW (ref 3.87–5.11)
RDW: 17.5 % — ABNORMAL HIGH (ref 11.5–15.5)
WBC: 10.1 10*3/uL (ref 4.0–10.5)

## 2018-05-07 LAB — IRON AND TIBC
IRON: 108 ug/dL (ref 28–170)
Saturation Ratios: 33 % — ABNORMAL HIGH (ref 10.4–31.8)
TIBC: 326 ug/dL (ref 250–450)
UIBC: 218 ug/dL

## 2018-05-07 LAB — PREPARE RBC (CROSSMATCH)

## 2018-05-07 LAB — FERRITIN: FERRITIN: 71 ng/mL (ref 11–307)

## 2018-05-07 MED ORDER — ZOLPIDEM TARTRATE 5 MG PO TABS: 5.0000 mg | ORAL_TABLET | Freq: Every evening | ORAL | Status: DC | PRN

## 2018-05-07 MED ORDER — SODIUM CHLORIDE 0.9 % IV SOLN
510.0000 mg | Freq: Once | INTRAVENOUS | Status: DC
  Filled 2018-05-07: qty 17

## 2018-05-07 MED ORDER — DOCUSATE SODIUM 100 MG PO CAPS: 100.0000 mg | ORAL_CAPSULE | Freq: Every day | ORAL | Status: DC

## 2018-05-07 MED ORDER — ACETAMINOPHEN 325 MG PO TABS: 650.0000 mg | ORAL_TABLET | ORAL | Status: DC | PRN

## 2018-05-07 MED ORDER — SODIUM CHLORIDE 0.9% FLUSH: 3.0000 mL | Freq: Two times a day (BID) | INTRAVENOUS | Status: DC

## 2018-05-07 MED ORDER — DIPHENHYDRAMINE HCL 25 MG PO CAPS
25.0000 mg | ORAL_CAPSULE | Freq: Once | ORAL | Status: AC
  Administered 2018-05-07: 25 mg via ORAL
  Filled 2018-05-07: qty 1

## 2018-05-07 MED ORDER — FERROUS SULFATE 325 (65 FE) MG PO TBEC: 325.0000 mg | DELAYED_RELEASE_TABLET | Freq: Two times a day (BID) | ORAL | 2 refills | Status: DC

## 2018-05-07 MED ORDER — ACETAMINOPHEN 325 MG PO TABS
650.0000 mg | ORAL_TABLET | Freq: Once | ORAL | Status: AC
  Administered 2018-05-07: 650 mg via ORAL
  Filled 2018-05-07: qty 2

## 2018-05-07 MED ORDER — SODIUM CHLORIDE 0.9 % IV SOLN: 750.0000 mg | Freq: Once | INTRAVENOUS | Status: DC

## 2018-05-07 MED ORDER — SODIUM CHLORIDE 0.9% FLUSH: 3.0000 mL | INTRAVENOUS | Status: DC | PRN

## 2018-05-07 MED ORDER — SODIUM CHLORIDE 0.9 % IV SOLN: 250.0000 mL | INTRAVENOUS | Status: DC | PRN

## 2018-05-07 MED ORDER — CALCIUM CARBONATE ANTACID 500 MG PO CHEW: 2.0000 | CHEWABLE_TABLET | ORAL | Status: DC | PRN

## 2018-05-07 MED ORDER — SODIUM CHLORIDE 0.9% IV SOLUTION
Freq: Once | INTRAVENOUS | Status: AC
  Administered 2018-05-07: 13:00:00 via INTRAVENOUS

## 2018-05-07 NOTE — Progress Notes (Signed)
Critical lab value of Hgb 6.2 reported to Dr. Alfred Levins. No new orders. Pt to receive blood.

## 2018-05-07 NOTE — Discharge Summary (Addendum)
Physician Discharge Summary  Patient ID: Ashley Shaw MRN: [id number] DOB/AGE: [birth year] 32 y.o.  Admit date: 05/07/2018 Discharge date: 05/07/2018  Admission Diagnoses: anemia  Discharge Diagnoses:  Active Problems:   Anemia affecting pregnancy   Discharged Condition: good  Hospital Course: 32yo G4P1 presenting w/symptomatic anemia, hx of chronic anemia of unknown etiology, for pRBC. Iron studies done and Iron level WNL. Hgb was 6. She was transfused 3 units of pRBC with cefm and did well. She was discharged in stable condition. Iron transfusion not given since iron levels were normal. Plan for repeat anemia w/u pp as she was last worked up [place] years ago.   Consults: None  Significant Diagnostic Studies: labs:  CBC    Component Value Date/Time   WBC 10.1 05/07/2018 1141   RBC 1.83 (L) 05/07/2018 1141   HGB 6.2 (LL) 05/07/2018 1141   HCT 17.3 (L) 05/07/2018 1141   PLT 120 (L) 05/07/2018 1141   MCV 94.5 05/07/2018 1141   MCH 33.9 05/07/2018 1141   MCHC 35.8 05/07/2018 1141   RDW 17.5 (H) 05/07/2018 1141   LYMPHSABS 2.6 08/15/2012 1847   MONOABS 0.9 08/15/2012 1847   EOSABS 0.1 08/15/2012 1847   BASOSABS 0.0 08/15/2012 1847   Iron/TIBC/Ferritin/ %Sat    Component Value Date/Time   IRON 108 05/07/2018 1141   TIBC 326 05/07/2018 1141   FERRITIN 71 05/07/2018 1141   IRONPCTSAT 33 (H) 05/07/2018 1141    Treatments: 3 units pRBC  Discharge Exam: Blood pressure 109/64, pulse 92, temperature 98.9 F (37.2 C), temperature source Oral, resp. rate 18, height 5\' 9"  (1.753 m), weight 72.1 kg (159 lb), last menstrual period 07/25/2017, SpO2 100 %, unknown if currently breastfeeding. General appearance: alert, cooperative and appears stated age Resp: clear to auscultation bilaterally Cardio: regular rate and rhythm, S1, S2 normal, no murmur, click, rub or gallop GI: soft, non-tender; bowel sounds normal; no masses,  no organomegaly Extremities: extremities normal,  atraumatic, no cyanosis or edema  Disposition: Discharge disposition: 01-Home or Self Care       Discharge Instructions    Discharge activity:  No Restrictions   Complete by:  As directed    Discharge diet:  No restrictions   Complete by:  As directed    Discharge instructions   Complete by:  As directed    See [place] in office w/in a week to repeat CBC Take Iron   No sexual activity restrictions   Complete by:  As directed    Notify physician for a general feeling that "something is not right"   Complete by:  As directed    Notify physician for increase or change in vaginal discharge   Complete by:  As directed    Notify physician for intestinal cramps, with or without diarrhea, sometimes described as "gas pain"   Complete by:  As directed    Notify physician for leaking of fluid   Complete by:  As directed    Notify physician for low, dull backache, unrelieved by heat or Tylenol   Complete by:  As directed    Notify physician for menstrual like cramps   Complete by:  As directed    Notify physician for pelvic pressure   Complete by:  As directed    Notify physician for uterine contractions.  These may be painless and feel like the uterus is tightening or the baby is  "balling up"   Complete by:  As directed    Notify physician for vaginal bleeding  Complete by:  As directed    PRETERM LABOR:  Includes any of the follwing symptoms that occur between 20 - [redacted] weeks gestation.  If these symptoms are not stopped, preterm labor can result in preterm delivery, placing your baby at risk   Complete by:  As directed      Allergies as of 05/07/2018   No Known Allergies     Medication List    TAKE these medications   acetaminophen 500 MG tablet Commonly known as:  TYLENOL Take 500 mg by mouth every 6 (six) hours as needed for mild pain or headache.   ferrous sulfate 325 (65 FE) MG EC tablet Take 1 tablet (325 mg total) by mouth 2 (two) times daily.   prenatal multivitamin  Tabs tablet Take 1 tablet by mouth daily at 12 noon.        Signed: Tyson Dense 05/07/2018, 5:22 PM

## 2018-05-07 NOTE — Progress Notes (Signed)
Patient discharged home with husband.  Follow-up care reviewed; Medications discussed; Discharge instructions reviewed; Prescriptions reviewed; Admission discussed; Pt verbalized understanding.

## 2018-05-07 NOTE — H&P (Signed)
Ashley Shaw is a 32 y.o. female presenting for symptomatic anemia. At routine OB visit c/o fatigue and pica worsening over several weeks. No other c/o - no ctxn, bleeding or LOF. OB History    Gravida  4   Para  1   Term  1   Preterm  0   AB  2   Living  1     SAB  2   TAB  0   Ectopic  0   Multiple  0   Live Births  1          Past Medical History:  Diagnosis Date  . Acute urinary retention - immediate PP 04/13/2013  . Anemia   . Anemia of mother in pregnancy, delivered with postpartum condition 04/13/2013  . History of blood transfusion    Past Surgical History:  Procedure Laterality Date  . DILATION AND EVACUATION N/A 11/22/2015   Procedure: DILATATION AND EVACUATION;  Surgeon: Cheri Fowler, MD;  Location: [facility];  Service: Gynecology;  Laterality: N/A;  . LEEP  2009  . THERAPEUTIC ABORTION    . WISDOM TOOTH EXTRACTION  2006   Family History: family history is not on file. Social History:  reports that she has never smoked. She has never used smokeless tobacco. She reports that she drinks alcohol. She reports that she does not use drugs.     Maternal Diabetes: No Genetic Screening: Normal Maternal Ultrasounds/Referrals: Normal Fetal Ultrasounds or other Referrals:  None Maternal Substance Abuse:  No Significant Maternal Medications:  None Significant Maternal Lab Results:  None Other Comments:  None  ROS History   Blood pressure 126/75, pulse (!) 107, temperature 98.8 F (37.1 C), temperature source Oral, resp. rate 18, height 5\' 9"  (1.753 m), weight 72.1 kg (159 lb), last menstrual period 07/25/2017, SpO2 100 %, unknown if currently breastfeeding. Exam Physical Exam  (from office) NAD, A&O NWOB Abd soft, nondistended, gravid  Prenatal labs: ABO, Rh:   Antibody:   Rubella:   RPR:    HBsAg:    HIV:    GBS:     Assessment/Plan: 32yo GgP1031 @ 23.3 wga p/w symptomatic anemia. She has a h/o chronic anemia of unknown etiology requiring  multiple blood transfusions.  Per records review in [facility], in 2012 at age 32 was admitted for multiple blood transfusions with a hgb of 5. At time it was felt she had a combination of acute blood loss anemia s/s heavy periods in addition to AOCD. B12, viral panel, and stool cultures were negative at that time. She had a normal hgb electrophoresis during this pregnancy with a hgb of 10 at that time. It is unclear if she has had a wu for alpha thal previously. She has no c/o bleeding today and no symptoms except fatigue/thirst/pica. Given above history and that patient has no evidence of any bleeding on her exam, the most likely etiology of severe anemia is her history above. Thus, will admit for blood transfusion x 3 and likely IV iron. She was consented for blood and risks discussed including risk of reaction and infection. Plan to monitor her fetus while blood is infusing and for one hour after.   - 3u pRBC planned, CBC prior to transfusion to re-assess levels - Iron/TIBC/ferritin levels - Plan IV iron as iron likely low  Tyson Dense 05/07/2018, 11:51 AM

## 2018-05-08 LAB — TYPE AND SCREEN
ABO/RH(D): O POS
Antibody Screen: NEGATIVE
UNIT DIVISION: 0
Unit division: 0
Unit division: 0

## 2018-05-08 LAB — BPAM RBC
BLOOD PRODUCT EXPIRATION DATE: 201908182359
BLOOD PRODUCT EXPIRATION DATE: 201908192359
Blood Product Expiration Date: 201908182359
ISSUE DATE / TIME: 201907251328
ISSUE DATE / TIME: 201907251514
ISSUE DATE / TIME: 201907251651
UNIT TYPE AND RH: 5100
Unit Type and Rh: 5100
Unit Type and Rh: 5100

## 2018-05-25 ENCOUNTER — Encounter (HOSPITAL_COMMUNITY): Payer: Self-pay | Admitting: Anesthesiology

## 2018-05-25 ENCOUNTER — Observation Stay (HOSPITAL_COMMUNITY)
Admission: AD | Admit: 2018-05-25 | Discharge: 2018-05-25 | DRG: 833 | Disposition: A | Discharge status: Home / Self Care | Payer: 59 | Attending: Obstetrics and Gynecology | Admitting: Obstetrics and Gynecology

## 2018-05-25 DIAGNOSIS — O99012 Anemia complicating pregnancy, second trimester: Principal | ICD-10-CM | POA: Diagnosis present

## 2018-05-25 DIAGNOSIS — Z3A26 26 weeks gestation of pregnancy: Secondary | ICD-10-CM

## 2018-05-25 DIAGNOSIS — D649 Anemia, unspecified: Secondary | ICD-10-CM | POA: Diagnosis present

## 2018-05-25 LAB — CBC
HEMATOCRIT: 16.7 % — AB (ref 36.0–46.0)
HEMOGLOBIN: 5.9 g/dL — AB (ref 12.0–15.0)
MCH: 33.7 pg (ref 26.0–34.0)
MCHC: 35.3 g/dL (ref 30.0–36.0)
MCV: 95.4 fL (ref 78.0–100.0)
Platelets: 160 10*3/uL (ref 150–400)
RBC: 1.75 MIL/uL — ABNORMAL LOW (ref 3.87–5.11)
RDW: 18 % — ABNORMAL HIGH (ref 11.5–15.5)
WBC: 10.3 10*3/uL (ref 4.0–10.5)

## 2018-05-25 LAB — PREPARE RBC (CROSSMATCH)

## 2018-05-25 MED ORDER — SODIUM CHLORIDE 0.9 % IV SOLN
510.0000 mg | INTRAVENOUS | Status: DC
  Administered 2018-05-25: 510 mg via INTRAVENOUS
  Filled 2018-05-25: qty 17

## 2018-05-25 MED ORDER — ZOLPIDEM TARTRATE 5 MG PO TABS: 5.0000 mg | ORAL_TABLET | Freq: Every evening | ORAL | Status: DC | PRN

## 2018-05-25 MED ORDER — PRENATAL MULTIVITAMIN CH
1.0000 | ORAL_TABLET | Freq: Every day | ORAL | Status: DC
  Administered 2018-05-25: 1 via ORAL
  Filled 2018-05-25: qty 1

## 2018-05-25 MED ORDER — DIPHENHYDRAMINE HCL 25 MG PO CAPS
25.0000 mg | ORAL_CAPSULE | Freq: Once | ORAL | Status: AC
  Administered 2018-05-25: 25 mg via ORAL
  Filled 2018-05-25: qty 1

## 2018-05-25 MED ORDER — DOCUSATE SODIUM 100 MG PO CAPS: 100.0000 mg | ORAL_CAPSULE | Freq: Every day | ORAL | Status: DC

## 2018-05-25 MED ORDER — CALCIUM CARBONATE ANTACID 500 MG PO CHEW: 2.0000 | CHEWABLE_TABLET | ORAL | Status: DC | PRN

## 2018-05-25 MED ORDER — ACETAMINOPHEN 325 MG PO TABS
650.0000 mg | ORAL_TABLET | Freq: Once | ORAL | Status: AC
  Administered 2018-05-25: 650 mg via ORAL
  Filled 2018-05-25: qty 2

## 2018-05-25 MED ORDER — ACETAMINOPHEN 325 MG PO TABS: 650.0000 mg | ORAL_TABLET | ORAL | Status: DC | PRN

## 2018-05-25 MED ORDER — SODIUM CHLORIDE 0.9% IV SOLUTION
Freq: Once | INTRAVENOUS | Status: AC
  Administered 2018-05-25: 15:00:00 via INTRAVENOUS

## 2018-05-25 NOTE — Discharge Instructions (Signed)

## 2018-05-25 NOTE — H&P (Signed)
Ashley Shaw is a 32 year old G 4 P 1102 at 26 weeks presents with symptomatic anemia.  She has had anemia for many years and has had workup at [facility]. She has had blood transfusions in the past End of July - hemoglobin 5.5 and patient given 3 units of pRBCs and she felt better until this weekend Today hemoglobin 6.1 OB History    Gravida  4   Para  1   Term  1   Preterm  0   AB  2   Living  1     SAB  2   TAB  0   Ectopic  0   Multiple  0   Live Births  1          Past Medical History:  Diagnosis Date  . Acute urinary retention - immediate PP 04/13/2013  . Anemia   . Anemia of mother in pregnancy, delivered with postpartum condition 04/13/2013  . History of blood transfusion    Past Surgical History:  Procedure Laterality Date  . DILATION AND EVACUATION N/A 11/22/2015   Procedure: DILATATION AND EVACUATION;  Surgeon: Cheri Fowler, MD;  Location: [facility];  Service: Gynecology;  Laterality: N/A;  . LEEP  2009  . THERAPEUTIC ABORTION    . WISDOM TOOTH EXTRACTION  2006   Family History: family history is not on file. Social History:  reports that she has never smoked. She has never used smokeless tobacco. She reports that she drinks alcohol. She reports that she does not use drugs.     Maternal Diabetes: No Genetic Screening: Normal Maternal Ultrasounds/Referrals: Normal Fetal Ultrasounds or other Referrals:  None Maternal Substance Abuse:  No Significant Maternal Medications:  None Significant Maternal Lab Results:  None Other Comments:  None  Review of Systems  All other systems reviewed and are negative.  History   Blood pressure 95/70, pulse (!) 107, temperature 98.5 F (36.9 C), temperature source Oral, resp. rate 18, last menstrual period 07/25/2017, SpO2 100 %, unknown if currently breastfeeding. Exam Physical Exam  Prenatal labs: ABO, Rh: --/--/O POS (07/25 1141) Antibody: NEG (07/25 1141) Rubella:   RPR:    HBsAg:    HIV:    GBS:      Assessment/Plan: IUP at 26 weeks Severe Anemia Admit today  Transfuse 3 units pRBCs - risks reviewed with patient Feraheme  Ashley Shaw 05/25/2018, 12:27 PM

## 2018-05-25 NOTE — Discharge Summary (Signed)
Admission Diagnosis: IUP at 26 weeks Anemia  Discharge Diagnosis: Same  Hospital Course: 33 year old female with 26 weeks admitted for blood transfusion. She had hematocrit of 5.9 on admission and received one dosage of feraheme and 3 units of packed red blood cells. She was discharged in good condition. She will follow up in our office in one week.

## 2018-05-25 NOTE — Progress Notes (Signed)
Pt states due date Aug 31, 2018

## 2018-05-26 LAB — BPAM RBC
Blood Product Expiration Date: 201908212359
Blood Product Expiration Date: 201908212359
Blood Product Expiration Date: 201908212359
ISSUE DATE / TIME: 201908121632
ISSUE DATE / TIME: 201908121842
ISSUE DATE / TIME: 201908122037
UNIT TYPE AND RH: 9500
Unit Type and Rh: 9500
Unit Type and Rh: 9500

## 2018-05-26 LAB — TYPE AND SCREEN
ABO/RH(D): O POS
Antibody Screen: NEGATIVE
Unit division: 0
Unit division: 0
Unit division: 0

## 2018-07-24 ENCOUNTER — Encounter (HOSPITAL_COMMUNITY): Payer: Self-pay

## 2018-08-26 LAB — OB RESULTS CONSOLE GBS: STREP GROUP B AG: NEGATIVE

## 2018-08-26 NOTE — H&P (Signed)
Ashley Shaw is a 32 y.o. female presenting for IOL at term. Pregnancy complicated by anemia with multiple transfusions of PRBC and IV iron infusion. Capillary hemoglobin in office 08/26/18=11.9. Heme consult done. Also H/O PP hemorrhage and transfusion after delivery #1.  OB History    Gravida  4   Para  1   Term  1   Preterm  0   AB  2   Living  1     SAB  2   TAB  0   Ectopic  0   Multiple  0   Live Births  1          Past Medical History:  Diagnosis Date  . Acute urinary retention - immediate PP 04/13/2013  . Anemia   . Anemia of mother in pregnancy, delivered with postpartum condition 04/13/2013  . History of blood transfusion    Past Surgical History:  Procedure Laterality Date  . DILATION AND EVACUATION N/A 11/22/2015   Procedure: DILATATION AND EVACUATION;  Surgeon: Cheri Fowler, MD;  Location: [facility];  Service: Gynecology;  Laterality: N/A;  . LEEP  2009  . THERAPEUTIC ABORTION    . WISDOM TOOTH EXTRACTION  2006   Family History: family history is not on file. Social History:  reports that she has never smoked. She has never used smokeless tobacco. She reports that she drinks alcohol. She reports that she does not use drugs.     Maternal Diabetes: No Genetic Screening: Normal Maternal Ultrasounds/Referrals: Normal Fetal Ultrasounds or other Referrals:  Referred to Materal Fetal Medicine , Other:  heme Maternal Substance Abuse:  No Significant Maternal Medications:  None Significant Maternal Lab Results:  None Other Comments:  None  Review of Systems  Eyes: Negative for blurred vision.  Gastrointestinal: Negative for abdominal pain.  Neurological: Negative for headaches.   Maternal Medical History:  Fetal activity: Perceived fetal activity is normal.        unknown if currently breastfeeding. Exam Physical Exam  Cardiovascular: Normal rate.  Respiratory: Effort normal.  GI: Soft.    Cx 2/60/-2 in office 08/26/18  Prenatal  labs: ABO, Rh: --/--/O POS (08/12 1248) Antibody: NEG (08/12 1248) Rubella:  immune RPR:   NR HBsAg:  NR  HIV:   NR GBS:  negative   Assessment/Plan: 32 yo G5P1 at term H/O anemia with good cap hgb today in office H/O PPH Two stage IOL T&C 2 units PRBC   Shon Millet II 08/26/2018, 4:59 PM

## 2018-08-27 ENCOUNTER — Inpatient Hospital Stay (HOSPITAL_COMMUNITY)
Admission: RE | Admit: 2018-08-27 | Discharge: 2018-08-29 | DRG: 807 | Disposition: A | Discharge status: Home / Self Care | Payer: 59 | Attending: Obstetrics and Gynecology | Admitting: Obstetrics and Gynecology

## 2018-08-27 ENCOUNTER — Inpatient Hospital Stay (HOSPITAL_COMMUNITY): Payer: 59 | Admitting: Anesthesiology

## 2018-08-27 ENCOUNTER — Encounter (HOSPITAL_COMMUNITY): Payer: Self-pay

## 2018-08-27 ENCOUNTER — Other Ambulatory Visit: Payer: Self-pay

## 2018-08-27 DIAGNOSIS — D649 Anemia, unspecified: Secondary | ICD-10-CM | POA: Diagnosis present

## 2018-08-27 DIAGNOSIS — Z3A39 39 weeks gestation of pregnancy: Secondary | ICD-10-CM | POA: Diagnosis not present

## 2018-08-27 DIAGNOSIS — O9902 Anemia complicating childbirth: Principal | ICD-10-CM | POA: Diagnosis present

## 2018-08-27 DIAGNOSIS — Z3490 Encounter for supervision of normal pregnancy, unspecified, unspecified trimester: Secondary | ICD-10-CM

## 2018-08-27 LAB — PREPARE RBC (CROSSMATCH)

## 2018-08-27 LAB — CBC
HEMATOCRIT: 34.7 % — AB (ref 36.0–46.0)
HEMOGLOBIN: 11.4 g/dL — AB (ref 12.0–15.0)
MCH: 33 pg (ref 26.0–34.0)
MCHC: 32.9 g/dL (ref 30.0–36.0)
MCV: 100.6 fL — AB (ref 80.0–100.0)
Platelets: 152 10*3/uL (ref 150–400)
RBC: 3.45 MIL/uL — ABNORMAL LOW (ref 3.87–5.11)
RDW: 15.2 % (ref 11.5–15.5)
WBC: 7.5 10*3/uL (ref 4.0–10.5)

## 2018-08-27 LAB — RPR: RPR Ser Ql: NONREACTIVE

## 2018-08-27 MED ORDER — COCONUT OIL OIL: 1.0000 "application " | TOPICAL_OIL | Status: DC | PRN

## 2018-08-27 MED ORDER — LACTATED RINGERS IV SOLN
INTRAVENOUS | Status: DC
  Administered 2018-08-27 (×2): via INTRAVENOUS

## 2018-08-27 MED ORDER — BENZOCAINE-MENTHOL 20-0.5 % EX AERO
1.0000 "application " | INHALATION_SPRAY | CUTANEOUS | Status: DC | PRN
  Administered 2018-08-27 – 2018-08-29 (×2): 1 via TOPICAL
  Filled 2018-08-27 (×2): qty 56

## 2018-08-27 MED ORDER — ONDANSETRON HCL 4 MG/2ML IJ SOLN: 4.0000 mg | INTRAMUSCULAR | Status: DC | PRN

## 2018-08-27 MED ORDER — ZOLPIDEM TARTRATE 5 MG PO TABS: 5.0000 mg | ORAL_TABLET | Freq: Every evening | ORAL | Status: DC | PRN

## 2018-08-27 MED ORDER — ACETAMINOPHEN 325 MG PO TABS: 650.0000 mg | ORAL_TABLET | ORAL | Status: DC | PRN

## 2018-08-27 MED ORDER — PHENYLEPHRINE 40 MCG/ML (10ML) SYRINGE FOR IV PUSH (FOR BLOOD PRESSURE SUPPORT): 80.0000 ug | PREFILLED_SYRINGE | INTRAVENOUS | Status: DC | PRN

## 2018-08-27 MED ORDER — OXYTOCIN 40 UNITS IN LACTATED RINGERS INFUSION - SIMPLE MED
2.5000 [IU]/h | INTRAVENOUS | Status: DC
  Administered 2018-08-27: 2.5 [IU]/h via INTRAVENOUS

## 2018-08-27 MED ORDER — HYDROXYZINE HCL 50 MG PO TABS
50.0000 mg | ORAL_TABLET | Freq: Four times a day (QID) | ORAL | Status: DC | PRN
  Administered 2018-08-27: 50 mg via ORAL
  Filled 2018-08-27: qty 1

## 2018-08-27 MED ORDER — TETANUS-DIPHTH-ACELL PERTUSSIS 5-2.5-18.5 LF-MCG/0.5 IM SUSP: 0.5000 mL | Freq: Once | INTRAMUSCULAR | Status: DC

## 2018-08-27 MED ORDER — SOD CITRATE-CITRIC ACID 500-334 MG/5ML PO SOLN: 30.0000 mL | ORAL | Status: DC | PRN

## 2018-08-27 MED ORDER — ACETAMINOPHEN 325 MG PO TABS
650.0000 mg | ORAL_TABLET | ORAL | Status: DC | PRN
  Administered 2018-08-27 – 2018-08-28 (×5): 650 mg via ORAL
  Filled 2018-08-27 (×5): qty 2

## 2018-08-27 MED ORDER — IBUPROFEN 600 MG PO TABS
600.0000 mg | ORAL_TABLET | Freq: Four times a day (QID) | ORAL | Status: DC
  Administered 2018-08-27 – 2018-08-29 (×6): 600 mg via ORAL
  Filled 2018-08-27 (×7): qty 1

## 2018-08-27 MED ORDER — FLEET ENEMA 7-19 GM/118ML RE ENEM: 1.0000 | ENEMA | RECTAL | Status: DC | PRN

## 2018-08-27 MED ORDER — LACTATED RINGERS IV SOLN: 500.0000 mL | INTRAVENOUS | Status: DC | PRN

## 2018-08-27 MED ORDER — LIDOCAINE HCL (PF) 1 % IJ SOLN
30.0000 mL | INTRAMUSCULAR | Status: DC | PRN
  Filled 2018-08-27: qty 30

## 2018-08-27 MED ORDER — FENTANYL 2.5 MCG/ML BUPIVACAINE 1/10 % EPIDURAL INFUSION (WH - ANES)
14.0000 mL/h | INTRAMUSCULAR | Status: DC | PRN
  Administered 2018-08-27: 14 mL/h via EPIDURAL
  Filled 2018-08-27: qty 100

## 2018-08-27 MED ORDER — OXYCODONE-ACETAMINOPHEN 5-325 MG PO TABS: 2.0000 | ORAL_TABLET | ORAL | Status: DC | PRN

## 2018-08-27 MED ORDER — DIPHENHYDRAMINE HCL 25 MG PO CAPS: 25.0000 mg | ORAL_CAPSULE | Freq: Four times a day (QID) | ORAL | Status: DC | PRN

## 2018-08-27 MED ORDER — ONDANSETRON HCL 4 MG PO TABS: 4.0000 mg | ORAL_TABLET | ORAL | Status: DC | PRN

## 2018-08-27 MED ORDER — EPHEDRINE 5 MG/ML INJ: 10.0000 mg | INTRAVENOUS | Status: DC | PRN

## 2018-08-27 MED ORDER — TERBUTALINE SULFATE 1 MG/ML IJ SOLN: 0.2500 mg | Freq: Once | INTRAMUSCULAR | Status: DC | PRN

## 2018-08-27 MED ORDER — OXYCODONE-ACETAMINOPHEN 5-325 MG PO TABS: 1.0000 | ORAL_TABLET | ORAL | Status: DC | PRN

## 2018-08-27 MED ORDER — PRENATAL MULTIVITAMIN CH
1.0000 | ORAL_TABLET | Freq: Every day | ORAL | Status: DC
  Administered 2018-08-28: 1 via ORAL
  Filled 2018-08-27: qty 1

## 2018-08-27 MED ORDER — LIDOCAINE-EPINEPHRINE (PF) 2 %-1:200000 IJ SOLN
INTRAMUSCULAR | Status: DC | PRN
  Administered 2018-08-27: 8 mL via EPIDURAL

## 2018-08-27 MED ORDER — OXYTOCIN 10 UNIT/ML IJ SOLN
INTRAMUSCULAR | Status: AC
  Administered 2018-08-27: 10 [IU]
  Filled 2018-08-27: qty 2

## 2018-08-27 MED ORDER — MISOPROSTOL 200 MCG PO TABS
ORAL_TABLET | ORAL | Status: AC
  Filled 2018-08-27: qty 4

## 2018-08-27 MED ORDER — METHYLERGONOVINE MALEATE 0.2 MG PO TABS: 0.2000 mg | ORAL_TABLET | ORAL | Status: DC | PRN

## 2018-08-27 MED ORDER — LACTATED RINGERS IV SOLN
500.0000 mL | Freq: Once | INTRAVENOUS | Status: AC
  Administered 2018-08-27: 500 mL via INTRAVENOUS

## 2018-08-27 MED ORDER — SENNOSIDES-DOCUSATE SODIUM 8.6-50 MG PO TABS
2.0000 | ORAL_TABLET | ORAL | Status: DC
  Administered 2018-08-28 – 2018-08-29 (×2): 2 via ORAL
  Filled 2018-08-27 (×2): qty 2

## 2018-08-27 MED ORDER — WITCH HAZEL-GLYCERIN EX PADS: 1.0000 "application " | MEDICATED_PAD | CUTANEOUS | Status: DC | PRN

## 2018-08-27 MED ORDER — ONDANSETRON HCL 4 MG/2ML IJ SOLN
4.0000 mg | Freq: Four times a day (QID) | INTRAMUSCULAR | Status: DC | PRN
  Administered 2018-08-27: 4 mg via INTRAVENOUS
  Filled 2018-08-27: qty 2

## 2018-08-27 MED ORDER — SODIUM CHLORIDE 0.9% IV SOLUTION: Freq: Once | INTRAVENOUS | Status: DC

## 2018-08-27 MED ORDER — MISOPROSTOL 25 MCG QUARTER TABLET
25.0000 ug | ORAL_TABLET | ORAL | Status: DC | PRN
  Administered 2018-08-27 (×2): 25 ug via VAGINAL
  Filled 2018-08-27 (×2): qty 1

## 2018-08-27 MED ORDER — METHYLERGONOVINE MALEATE 0.2 MG/ML IJ SOLN
INTRAMUSCULAR | Status: AC
  Filled 2018-08-27: qty 1

## 2018-08-27 MED ORDER — DIBUCAINE 1 % RE OINT: 1.0000 "application " | TOPICAL_OINTMENT | RECTAL | Status: DC | PRN

## 2018-08-27 MED ORDER — OXYTOCIN 10 UNIT/ML IJ SOLN: 10.0000 [IU] | Freq: Once | INTRAMUSCULAR | Status: DC

## 2018-08-27 MED ORDER — LIDOCAINE HCL (PF) 1 % IJ SOLN
INTRAMUSCULAR | Status: DC | PRN
  Administered 2018-08-27: 8 mL via EPIDURAL

## 2018-08-27 MED ORDER — OXYTOCIN 40 UNITS IN LACTATED RINGERS INFUSION - SIMPLE MED
1.0000 m[IU]/min | INTRAVENOUS | Status: DC
  Administered 2018-08-27: 2 m[IU]/min via INTRAVENOUS
  Filled 2018-08-27: qty 1000

## 2018-08-27 MED ORDER — BUTORPHANOL TARTRATE 1 MG/ML IJ SOLN: 1.0000 mg | INTRAMUSCULAR | Status: DC | PRN

## 2018-08-27 MED ORDER — METHYLERGONOVINE MALEATE 0.2 MG/ML IJ SOLN: 0.2000 mg | INTRAMUSCULAR | Status: DC | PRN

## 2018-08-27 MED ORDER — DIPHENHYDRAMINE HCL 50 MG/ML IJ SOLN: 12.5000 mg | INTRAMUSCULAR | Status: DC | PRN

## 2018-08-27 MED ORDER — PHENYLEPHRINE 40 MCG/ML (10ML) SYRINGE FOR IV PUSH (FOR BLOOD PRESSURE SUPPORT)
80.0000 ug | PREFILLED_SYRINGE | INTRAVENOUS | Status: DC | PRN
  Filled 2018-08-27: qty 10

## 2018-08-27 MED ORDER — OXYTOCIN BOLUS FROM INFUSION
500.0000 mL | Freq: Once | INTRAVENOUS | Status: AC
  Administered 2018-08-27: 500 mL via INTRAVENOUS

## 2018-08-27 MED ORDER — SIMETHICONE 80 MG PO CHEW: 80.0000 mg | CHEWABLE_TABLET | ORAL | Status: DC | PRN

## 2018-08-27 NOTE — Progress Notes (Signed)
FHT cat one UCs q2-4 min Cx 2/80/-2/vtx Patient barely able to tolerate cx check, unable to tolerate AROM Will get epidural>then AROM

## 2018-08-27 NOTE — Anesthesia Procedure Notes (Signed)
Epidural Patient location during procedure: OB Start time: 08/27/2018 10:20 AM End time: 08/27/2018 10:30 AM  Staffing Anesthesiologist: Janeece Riggers, MD  Preanesthetic Checklist Completed: patient identified, site marked, surgical consent, pre-op evaluation, timeout performed, IV checked, risks and benefits discussed and monitors and equipment checked  Epidural Patient position: sitting Prep: site prepped and draped and DuraPrep Patient monitoring: continuous pulse ox and blood pressure Approach: midline Location: L4-L5 Injection technique: LOR air  Needle:  Needle type: Tuohy  Needle gauge: 17 G Needle length: 9 cm and 9 Needle insertion depth: 5 cm cm Catheter type: closed end flexible Catheter size: 19 Gauge Catheter at skin depth: 10 cm Test dose: negative  Assessment Events: blood not aspirated, injection not painful, no injection resistance, negative IV test and no paresthesia

## 2018-08-27 NOTE — Progress Notes (Signed)
Delivery Note At 5:02 PM a viable female was delivered via  (Presentation:LOA ;  ).  APGAR: , ; weight  .   Placenta status intact, .  Cord:3 vessels  with the following complications:none .  Cord pH: pending Rapid second stage  IV lost during delivery While working to replace IV>uterine massage and IM pitocin x 20 IV established and uterus firm   Anesthesia:   Episiotomy: None Lacerations: 2nd degree ML lac repaired Suture Repair: 2.0 vicryl rapide Est. Blood Loss (mL):  300  Mom to postpartum.  Baby to Couplet care / Skin to Skin.  Ashley Shaw 08/27/2018, 5:23 PM

## 2018-08-27 NOTE — Progress Notes (Signed)
FHT cat one UCs q52min Cx 3/80/-2 AROM clear Check cervix in 1 hour If no change>start pitocin augmentation

## 2018-08-27 NOTE — Progress Notes (Signed)
FHT cat one UCs q2-4 min cx no change IUPC placed Will begin pitocin augmentation

## 2018-08-27 NOTE — Anesthesia Pain Management Evaluation Note (Signed)
  CRNA Pain Management Visit Note  Patient: Ashley Shaw, 32 y.o., female  "Hello I am a member of the anesthesia team at [facility]. We have an anesthesia team available at all times to provide care throughout the hospital, including epidural management and anesthesia for C-section. I don't know your plan for the delivery whether it a natural birth, water birth, IV sedation, nitrous supplementation, doula or epidural, but we want to meet your pain goals."   1.Was your pain managed to your expectations on prior hospitalizations?   Yes   2.What is your expectation for pain management during this hospitalization?     Epidural  3.How can we help you reach that goal? Epidural when appropriate  Record the patient's initial score and the patient's pain goal.   Pain: 6  Pain Goal: 6 [facility] wants you to be able to say your pain was always managed very well.  Bufford Spikes 08/27/2018

## 2018-08-27 NOTE — Anesthesia Preprocedure Evaluation (Signed)
Anesthesia Evaluation  Patient identified by MRN, date of birth, ID band Patient awake    Reviewed: Allergy & Precautions, H&P , NPO status , Patient's Chart, lab work & pertinent test results, reviewed documented beta blocker date and time   Airway Mallampati: I  TM Distance: >3 FB Neck ROM: full    Dental no notable dental hx.    Pulmonary neg pulmonary ROS,    Pulmonary exam normal breath sounds clear to auscultation       Cardiovascular Exercise Tolerance: Good negative cardio ROS   Rhythm:regular Rate:Normal     Neuro/Psych negative neurological ROS  negative psych ROS   GI/Hepatic negative GI ROS, Neg liver ROS,   Endo/Other  negative endocrine ROS  Renal/GU negative Renal ROS  negative genitourinary   Musculoskeletal   Abdominal   Peds  Hematology negative hematology ROS (+)   Anesthesia Other Findings   Reproductive/Obstetrics negative OB ROS                             Anesthesia Physical Anesthesia Plan  ASA: II  Anesthesia Plan: General   Post-op Pain Management:    Induction:   PONV Risk Score and Plan:   Airway Management Planned:   Additional Equipment:   Intra-op Plan:   Post-operative Plan:   Informed Consent: I have reviewed the patients History and Physical, chart, labs and discussed the procedure including the risks, benefits and alternatives for the proposed anesthesia with the patient or authorized representative who has indicated his/her understanding and acceptance.   Dental Advisory Given  Plan Discussed with: CRNA  Anesthesia Plan Comments: (Labs checked- platelets confirmed with RN in room. Fetal heart tracing, per RN, reported to be stable enough for sitting procedure. Discussed epidural, and patient consents to the procedure:  included risk of possible headache,backache, failed block, allergic reaction, and nerve injury. This patient was  asked if she had any questions or concerns before the procedure started.)        Anesthesia Quick Evaluation

## 2018-08-28 LAB — CBC
HEMATOCRIT: 30.9 % — AB (ref 36.0–46.0)
Hemoglobin: 10 g/dL — ABNORMAL LOW (ref 12.0–15.0)
MCH: 32.4 pg (ref 26.0–34.0)
MCHC: 32.4 g/dL (ref 30.0–36.0)
MCV: 100 fL (ref 80.0–100.0)
NRBC: 0 % (ref 0.0–0.2)
Platelets: 164 10*3/uL (ref 150–400)
RBC: 3.09 MIL/uL — ABNORMAL LOW (ref 3.87–5.11)
RDW: 15.4 % (ref 11.5–15.5)
WBC: 7.9 10*3/uL (ref 4.0–10.5)

## 2018-08-28 NOTE — Anesthesia Postprocedure Evaluation (Signed)
Anesthesia Post Note  Patient: Ashley Shaw  Procedure(s) Performed: AN AD HOC LABOR EPIDURAL     Patient location during evaluation: Mother Baby Anesthesia Type: Epidural Level of consciousness: awake, awake and alert and oriented Pain management: pain level controlled Vital Signs Assessment: post-procedure vital signs reviewed and stable Respiratory status: spontaneous breathing, nonlabored ventilation and respiratory function stable Cardiovascular status: stable Postop Assessment: no headache, no backache, adequate PO intake, able to ambulate, patient able to bend at knees and no apparent nausea or vomiting Anesthetic complications: no    Last Vitals:  Vitals:   08/28/18 0042 08/28/18 0543  BP: 114/89 96/60  Pulse: 85 75  Resp: 16 19  Temp: 37.2 C 36.7 C  SpO2:      Last Pain:  Vitals:   08/28/18 0544  TempSrc:   PainSc: Asleep   Pain Goal:                 Lamar Naef

## 2018-08-28 NOTE — Progress Notes (Signed)
No complaints. BP 96/60 (BP Location: Left Arm)   Pulse 75   Temp 98.1 F (36.7 C)   Resp 19   Ht 5\' 9"  (1.753 m)   Wt 86.5 kg   SpO2 99%   Breastfeeding? Unknown   BMI 28.15 kg/m  Results for orders placed or performed during the hospital encounter of 08/27/18 (from the past 24 hour(s))  CBC     Status: Abnormal   Collection Time: 08/28/18  5:21 AM  Result Value Ref Range   WBC 7.9 4.0 - 10.5 K/uL   RBC 3.09 (L) 3.87 - 5.11 MIL/uL   Hemoglobin 10.0 (L) 12.0 - 15.0 g/dL   HCT 30.9 (L) 36.0 - 46.0 %   MCV 100.0 80.0 - 100.0 fL   MCH 32.4 26.0 - 34.0 pg   MCHC 32.4 30.0 - 36.0 g/dL   RDW 15.4 11.5 - 15.5 %   Platelets 164 150 - 400 K/uL   nRBC 0.0 0.0 - 0.2 %   Abdomen is soft and non tender Lochia WNL  IMPRESSION: PPD # 1  Doing well Routine care Circ today Discharge home tomorrow

## 2018-08-28 NOTE — Lactation Note (Signed)
This note was copied from a baby's chart. Lactation Consultation Note  Patient Name: Ashley Shaw Today's Date: 08/28/2018 Reason for consult: Initial assessment;Term Breastfeeding consultation services and support information given and reviewed.  This is mom's third baby and she breastfed her last baby for 2 weeks.  She chooses to both breast and formula feed newborn.  Baby is 68 hours old and latched initially but mom states she was too tired and has formula fed since.  Baby is skin to skin on mom's chest after bath.  I offered to assist with breastfeeding but mom declined and chooses to give formula.  Instructed to feed with cues and call out when she is ready to put baby to the breast.  Maternal Data Does the patient have breastfeeding experience prior to this delivery?: Yes  Feeding    LATCH Score                   Interventions    Lactation Tools Discussed/Used     Consult Status Consult Status: Follow-up Date: 08/29/18 Follow-up type: In-patient    Ave Filter 08/28/2018, 11:22 AM

## 2018-08-29 NOTE — Discharge Summary (Signed)
Obstetric Discharge Summary Reason for Admission: induction of labor Prenatal Procedures: none Intrapartum Procedures: spontaneous vaginal delivery Postpartum Procedures: none Complications-Operative and Postpartum: none Hemoglobin  Date Value Ref Range Status  08/28/2018 10.0 (L) 12.0 - 15.0 g/dL Final   HCT  Date Value Ref Range Status  08/28/2018 30.9 (L) 36.0 - 46.0 % Final    Physical Exam:  General: alert, cooperative and appears stated age 32: appropriate Uterine Fundus: firm Incision: healing well, no significant drainage, no dehiscence DVT Evaluation: No evidence of DVT seen on physical exam.  Discharge Diagnoses: Term Pregnancy-delivered  Discharge Information: Date: 08/29/2018 Activity: pelvic rest Diet: routine Medications: None Condition: improved Instructions: refer to practice specific booklet Discharge to: home   Newborn Data: Live born female  Birth Weight: 8 lb 4.8 oz (3765 g) APGAR: 9, 9  Newborn Delivery   Birth date/time:  08/27/2018 17:02:00 Delivery type:  Vaginal, Spontaneous     Home with mother.  Cloe Sockwell L 08/29/2018, 8:02 AM

## 2018-08-30 ENCOUNTER — Ambulatory Visit: Payer: Self-pay

## 2018-08-30 NOTE — Lactation Note (Signed)
This note was copied from a baby's chart. Lactation Consultation Note  Patient Name: Boy Joelyn Lover Today's Date: 08/30/2018     Maternal Data    Feeding    LATCH Score                   Interventions    Lactation Tools Discussed/Used     Consult Status      Lanice Schwab Cleotha Tsang 08/30/2018, 6:39 PM

## 2018-08-31 LAB — BPAM RBC
BLOOD PRODUCT EXPIRATION DATE: 201912112359
BLOOD PRODUCT EXPIRATION DATE: 201912122359
UNIT TYPE AND RH: 5100
Unit Type and Rh: 5100

## 2018-08-31 LAB — TYPE AND SCREEN
ABO/RH(D): O POS
Antibody Screen: NEGATIVE
UNIT DIVISION: 0
Unit division: 0

## 2019-08-20 ENCOUNTER — Other Ambulatory Visit: Payer: Self-pay

## 2019-08-20 ENCOUNTER — Emergency Department (HOSPITAL_COMMUNITY): Payer: 59

## 2019-08-20 ENCOUNTER — Encounter (HOSPITAL_COMMUNITY): Payer: Self-pay | Admitting: Emergency Medicine

## 2019-08-20 ENCOUNTER — Emergency Department (HOSPITAL_COMMUNITY)
Admission: EM | Admit: 2019-08-20 | Discharge: 2019-08-20 | Disposition: A | Discharge status: Home / Self Care | Payer: 59 | Attending: Emergency Medicine | Admitting: Emergency Medicine

## 2019-08-20 DIAGNOSIS — Z20822 Contact with and (suspected) exposure to covid-19: Secondary | ICD-10-CM

## 2019-08-20 DIAGNOSIS — M791 Myalgia, unspecified site: Secondary | ICD-10-CM | POA: Diagnosis present

## 2019-08-20 DIAGNOSIS — U071 COVID-19: Secondary | ICD-10-CM | POA: Diagnosis not present

## 2019-08-20 DIAGNOSIS — Z79899 Other long term (current) drug therapy: Secondary | ICD-10-CM | POA: Insufficient documentation

## 2019-08-20 LAB — CBC WITH DIFFERENTIAL/PLATELET
Abs Immature Granulocytes: 0 10*3/uL (ref 0.00–0.07)
Basophils Absolute: 0 10*3/uL (ref 0.0–0.1)
Basophils Relative: 0 %
Eosinophils Absolute: 0 10*3/uL (ref 0.0–0.5)
Eosinophils Relative: 1 %
HCT: 41.7 % (ref 36.0–46.0)
Hemoglobin: 13.6 g/dL (ref 12.0–15.0)
Immature Granulocytes: 0 %
Lymphocytes Relative: 46 %
Lymphs Abs: 1.5 10*3/uL (ref 0.7–4.0)
MCH: 29.2 pg (ref 26.0–34.0)
MCHC: 32.6 g/dL (ref 30.0–36.0)
MCV: 89.7 fL (ref 80.0–100.0)
Monocytes Absolute: 0.6 10*3/uL (ref 0.1–1.0)
Monocytes Relative: 18 %
Neutro Abs: 1.1 10*3/uL — ABNORMAL LOW (ref 1.7–7.7)
Neutrophils Relative %: 35 %
Platelets: 137 10*3/uL — ABNORMAL LOW (ref 150–400)
RBC: 4.65 MIL/uL (ref 3.87–5.11)
RDW: 13.2 % (ref 11.5–15.5)
WBC: 3.3 10*3/uL — ABNORMAL LOW (ref 4.0–10.5)
nRBC: 0 % (ref 0.0–0.2)

## 2019-08-20 LAB — BASIC METABOLIC PANEL
Anion gap: 8 (ref 5–15)
BUN: 9 mg/dL (ref 6–20)
CO2: 23 mmol/L (ref 22–32)
Calcium: 8.9 mg/dL (ref 8.9–10.3)
Chloride: 108 mmol/L (ref 98–111)
Creatinine, Ser: 0.68 mg/dL (ref 0.44–1.00)
GFR calc Af Amer: >60 mL/min (ref >60)
GFR calc non Af Amer: >60 mL/min (ref >60)
Glucose, Bld: 90 mg/dL (ref 70–99)
Potassium: 3.3 mmol/L — ABNORMAL LOW (ref 3.5–5.1)
Sodium: 139 mmol/L (ref 135–145)

## 2019-08-20 LAB — I-STAT BETA HCG BLOOD, ED (MC, WL, AP ONLY): I-stat hCG, quantitative: <5 m[IU]/mL (ref <5)

## 2019-08-20 NOTE — ED Provider Notes (Signed)
TIME SEEN: 2:33 AM  CHIEF COMPLAINT: "I think I have Covid"  HPI: Patient is a 33 year old female with history of anemia who presents to the emergency department with concern that she has COVID-19.  States that on Tuesday, November 3 she began having symptoms of chills, body aches, dry cough.  Tonight she had loss of taste and smell.  No chest pain or shortness of breath.  No vomiting or diarrhea.  No known Covid exposures but states her son did have a high fever the day before and she took him to the pediatrician's office where he was found to have an ear infection.  ROS: See HPI Constitutional: no fever  Eyes: no drainage  ENT: no runny nose   Cardiovascular:  no chest pain  Resp: no SOB  GI: no vomiting GU: no dysuria Integumentary: no rash  Allergy: no hives  Musculoskeletal: no leg swelling  Neurological: no slurred speech ROS otherwise negative  PAST MEDICAL HISTORY/PAST SURGICAL HISTORY:  Past Medical History:  Diagnosis Date  . Acute urinary retention - immediate PP 04/13/2013  . Anemia   . Anemia of mother in pregnancy, delivered with postpartum condition 04/13/2013  . History of blood transfusion     MEDICATIONS:  Prior to Admission medications   Medication Sig Start Date End Date Taking? Authorizing Provider  acetaminophen (TYLENOL) 500 MG tablet Take 1,000 mg by mouth every 6 (six) hours as needed for mild pain or headache.     [provider]  ferrous sulfate 325 (65 FE) MG EC tablet Take 1 tablet (325 mg total) by mouth 2 (two) times daily. 05/07/18   Tyson Dense, MD  Prenatal Vit-Fe Fumarate-FA (PRENATAL MULTIVITAMIN) TABS tablet Take 1 tablet by mouth daily at 12 noon.    [provider]    ALLERGIES:  No Known Allergies  SOCIAL HISTORY:  Social History   Tobacco Use  . Smoking status: Never Smoker  . Smokeless tobacco: Never Used  Substance Use Topics  . Alcohol use: Yes    Comment: occasionally    FAMILY HISTORY: No family  history on file.  EXAM: BP 133/79 (BP Location: Left Arm)   Pulse 74   Temp 98.7 F (37.1 C) (Oral)   Resp 16   Ht 5\' 9"  (1.753 m)   Wt 63.5 kg   LMP 07/29/2019   SpO2 96%   BMI 20.67 kg/m  CONSTITUTIONAL: Alert and oriented and responds appropriately to questions. Well-appearing; well-nourished HEAD: Normocephalic EYES: Conjunctivae clear, pupils appear equal, EOMI ENT: normal nose; moist mucous membranes NECK: Supple, no meningismus, no nuchal rigidity, no LAD  CARD: RRR; S1 and S2 appreciated; no murmurs, no clicks, no rubs, no gallops RESP: Normal chest excursion without splinting or tachypnea; breath sounds clear and equal bilaterally; no wheezes, no rhonchi, no rales, no hypoxia or respiratory distress, speaking full sentences ABD/GI: Normal bowel sounds; non-distended; soft, non-tender, no rebound, no guarding, no peritoneal signs, no hepatosplenomegaly BACK:  The back appears normal and is non-tender to palpation, there is no CVA tenderness EXT: Normal ROM in all joints; non-tender to palpation; no edema; normal capillary refill; no cyanosis, no calf tenderness or swelling    SKIN: Normal color for age and race; warm; no rash NEURO: Moves all extremities equally PSYCH: The patient's mood and manner are appropriate. Grooming and personal hygiene are appropriate.  MEDICAL DECISION MAKING: Patient here with COVID-19 symptoms.  She is extremely well-appearing, well-hydrated, nontoxic without hypoxia, respiratory distress or shortness of breath.  Sats 100% on room air currently.  She has no chest pain or shortness of breath.  Chest x-ray clear.  Labs ordered in triage are pending.  She has requested COVID-19 testing.  Will send outpatient swab.  Discussed supportive care instructions, importance of isolation from her family at home, importance of quarantine for 10 days after the onset of symptoms and importance of updating any recent close contacts for quarantine of 14 days.  ED  PROGRESS: Labs unremarkable other than mild leukopenia which is likely in the setting of a viral illness.  Pregnancy test negative.  Continues to be hemodynamically stable.  I feel she is safe for discharge home.  At this time, I do not feel there is any life-threatening condition present. I have reviewed, interpreted and discussed all results (EKG, imaging, lab, urine as appropriate) and exam findings with patient/family. I have reviewed nursing notes and appropriate previous records.  I feel the patient is safe to be discharged home without further emergent workup and can continue workup as an outpatient as needed. Discussed usual and customary return precautions. Patient/family verbalize understanding and are comfortable with this plan.  Outpatient follow-up has been provided as needed. All questions have been answered.   Ashley Shaw was evaluated in Emergency Department on 08/20/2019 for the symptoms described in the history of present illness. She was evaluated in the context of the global COVID-19 pandemic, which necessitated consideration that the patient might be at risk for infection with the SARS-CoV-2 virus that causes COVID-19. Institutional protocols and algorithms that pertain to the evaluation of patients at risk for COVID-19 are in a state of rapid change based on information released by regulatory bodies including the CDC and federal and state organizations. These policies and algorithms were followed during the patient's care in the ED.    Ward, Delice Bison, DO 08/20/19 [phone]

## 2019-08-20 NOTE — ED Triage Notes (Signed)
Patient reports COVID symptoms onset this week with fever,chills , body aches , loss of smell and taste .

## 2019-08-20 NOTE — Discharge Instructions (Addendum)
COVID-19 is a viral illness that does not require antibiotics for treatment.  Your labs, chest x-ray today showed no significant abnormality.  Your vital signs have been normal.  You do not need to be admitted to the hospital at this time.  You may follow-up on the results for your COVID-19 test through my chart.  If you are positive, you will be contacted.  I recommend that even if you have a negative test, you treat this as if you have COVID-19 giving her symptoms.  You should attempt to self isolate from your family and to quarantine from others for at least 10 days after the onset of symptoms.  You will need to be fever free without using Tylenol or ibuprofen for 24 hours before coming out of quarantine and all of your symptoms other than the loss of taste and smell will need to have significantly improved.  Symptoms of loss of taste and smell can last up to 3 months but this does not mean you are contagious.  Any contacts that you have seen in the past 14 days before you became symptomatic will need to quarantine themselves for 14 full days.  If they become symptomatic, they will need to be tested as well.  You may alternate Tylenol 1000 mg every 6 hours as needed for pain and fever and Ibuprofen 800 mg every 8 hours as needed for pain and fever.  Please take Ibuprofen with food.   Steps to find a Primary Care Provider (PCP):  Call [phone] or [phone] to access "[facility]."  2.  You may also go on the [facility] website at [email or web address]  3.  [facility] also frequently accepts new patients.  [place] [phone]  4.  There are also multiple [facility], [facility] and [facility] practices throughout the [place] that are frequently accepting new patients. You may find a clinic that is close to your home and contact  them.  [facility] [email or web address] [phone]  [facility] [email or web address]  [email or web address] [place] [email or web address] [phone]  5.  Local Health Departments also can provide primary care services.  [facility]  [place] [address] [phone]  [facility] [place] [address] [place] [facility] [place] [phone]

## 2019-08-20 NOTE — ED Notes (Signed)
Patient verbalizes understanding of discharge instructions. Opportunity for questioning and answers were provided. Armband removed by staff, pt discharged from ED ambulatory.   

## 2019-08-23 LAB — NOVEL CORONAVIRUS, NAA (HOSP ORDER, SEND-OUT TO REF LAB; TAT 18-24 HRS): SARS-CoV-2, NAA: DETECTED — AB

## 2019-10-13 ENCOUNTER — Emergency Department (HOSPITAL_COMMUNITY): Payer: Managed Care, Other (non HMO)

## 2019-10-13 ENCOUNTER — Emergency Department (HOSPITAL_COMMUNITY)
Admission: EM | Admit: 2019-10-13 | Discharge: 2019-10-13 | Disposition: A | Discharge status: Home / Self Care | Payer: Managed Care, Other (non HMO) | Attending: Emergency Medicine | Admitting: Emergency Medicine

## 2019-10-13 ENCOUNTER — Other Ambulatory Visit: Payer: Self-pay

## 2019-10-13 DIAGNOSIS — I4891 Unspecified atrial fibrillation: Secondary | ICD-10-CM

## 2019-10-13 DIAGNOSIS — I471 Supraventricular tachycardia: Secondary | ICD-10-CM | POA: Diagnosis not present

## 2019-10-13 DIAGNOSIS — R002 Palpitations: Secondary | ICD-10-CM | POA: Diagnosis present

## 2019-10-13 LAB — D-DIMER, QUANTITATIVE: D-Dimer, Quant: 0.81 ug/mL-FEU — ABNORMAL HIGH (ref 0.00–0.50)

## 2019-10-13 LAB — COMPREHENSIVE METABOLIC PANEL
ALT: 14 U/L (ref 0–44)
AST: 20 U/L (ref 15–41)
Albumin: 4 g/dL (ref 3.5–5.0)
Alkaline Phosphatase: 51 U/L (ref 38–126)
Anion gap: 9 (ref 5–15)
BUN: 8 mg/dL (ref 6–20)
CO2: 22 mmol/L (ref 22–32)
Calcium: 9.6 mg/dL (ref 8.9–10.3)
Chloride: 108 mmol/L (ref 98–111)
Creatinine, Ser: 0.76 mg/dL (ref 0.44–1.00)
GFR calc Af Amer: >60 mL/min (ref >60)
GFR calc non Af Amer: >60 mL/min (ref >60)
Glucose, Bld: 78 mg/dL (ref 70–99)
Potassium: 3.5 mmol/L (ref 3.5–5.1)
Sodium: 139 mmol/L (ref 135–145)
Total Bilirubin: 0.7 mg/dL (ref 0.3–1.2)
Total Protein: 7.5 g/dL (ref 6.5–8.1)

## 2019-10-13 LAB — CBC WITH DIFFERENTIAL/PLATELET
Abs Immature Granulocytes: 0 10*3/uL (ref 0.00–0.07)
Basophils Absolute: 0 10*3/uL (ref 0.0–0.1)
Basophils Relative: 1 %
Eosinophils Absolute: 0 10*3/uL (ref 0.0–0.5)
Eosinophils Relative: 1 %
HCT: 42.2 % (ref 36.0–46.0)
Hemoglobin: 13.6 g/dL (ref 12.0–15.0)
Immature Granulocytes: 0 %
Lymphocytes Relative: 40 %
Lymphs Abs: 1.6 10*3/uL (ref 0.7–4.0)
MCH: 29.2 pg (ref 26.0–34.0)
MCHC: 32.2 g/dL (ref 30.0–36.0)
MCV: 90.8 fL (ref 80.0–100.0)
Monocytes Absolute: 0.4 10*3/uL (ref 0.1–1.0)
Monocytes Relative: 10 %
Neutro Abs: 2 10*3/uL (ref 1.7–7.7)
Neutrophils Relative %: 48 %
Platelets: 194 10*3/uL (ref 150–400)
RBC: 4.65 MIL/uL (ref 3.87–5.11)
RDW: 13.2 % (ref 11.5–15.5)
WBC: 4 10*3/uL (ref 4.0–10.5)
nRBC: 0 % (ref 0.0–0.2)

## 2019-10-13 LAB — I-STAT BETA HCG BLOOD, ED (MC, WL, AP ONLY): I-stat hCG, quantitative: <5 m[IU]/mL (ref <5)

## 2019-10-13 LAB — TSH: TSH: 1.684 u[IU]/mL (ref 0.350–4.500)

## 2019-10-13 LAB — MAGNESIUM: Magnesium: 1.9 mg/dL (ref 1.7–2.4)

## 2019-10-13 MED ORDER — ADENOSINE 6 MG/2ML IV SOLN
INTRAVENOUS | Status: AC
  Filled 2019-10-13: qty 6

## 2019-10-13 MED ORDER — ADENOSINE 6 MG/2ML IV SOLN
6.0000 mg | Freq: Once | INTRAVENOUS | Status: AC
  Administered 2019-10-13: 6 mg via INTRAVENOUS

## 2019-10-13 MED ORDER — METOPROLOL TARTRATE 25 MG PO TABS: 12.5000 mg | ORAL_TABLET | Freq: Two times a day (BID) | ORAL | 0 refills | Status: DC

## 2019-10-13 MED ORDER — METOPROLOL TARTRATE 25 MG PO TABS
12.5000 mg | ORAL_TABLET | Freq: Once | ORAL | Status: AC
  Administered 2019-10-13: 12.5 mg via ORAL
  Filled 2019-10-13: qty 1

## 2019-10-13 MED ORDER — IOHEXOL 350 MG/ML SOLN
100.0000 mL | Freq: Once | INTRAVENOUS | Status: AC | PRN
  Administered 2019-10-13: 100 mL via INTRAVENOUS

## 2019-10-13 NOTE — Discharge Instructions (Signed)
It appears you have an irregular heartbeat called atrial fibrillation.  Take the medication as prescribed to slow this heartbeat down.  The medication may cause some dizziness and lightheadedness.  You should be seen in the cardiology atrial fibrillation clinic this week.  They will likely call you but you should call them if you do not hear from them by the end of the week. Return to the ED with chest pain, shortness of breath, any other concerns.

## 2019-10-13 NOTE — Progress Notes (Signed)
   Called about patient in the ER with HR in the 200's - given adenosine and converted to afib (rate-controlled) - now feels much better. CHADVASC 1 (however, only for female gender) and onset of afib just after adenosine today, would not recommend anticoagulation at this time. Noted she was COVID positive in early November 2020. Recommend low dose metoprolol 12.5 mg BID. Will need follow-up this week in the afib clinic- echo recommended and may ultimately need cardioversion if she does not revert back to sinus +/- EP evaluation for ablation. D/w Dr. Wyvonnia Dusky and he is in agreement.  Pixie Casino, MD, [facility], [place] Director of the Advanced Lipid Disorders &  Cardiovascular Risk Reduction Clinic Diplomate of the American Board of Clinical Lipidology Attending Cardiologist  Direct Dial: [phone]  Fax: [phone]  Website:  [email or web address]

## 2019-10-13 NOTE — ED Triage Notes (Signed)
Pt arrives pov with c/o sob and lightheaded. On arrival pt HR 230's.

## 2019-10-13 NOTE — ED Provider Notes (Signed)
[name] EMERGENCY DEPARTMENT Provider Note   CSN: [id number] Arrival date & time: 10/13/19  1704     History No chief complaint on file.   Ashley Shaw is a 33 y.o. female.  Level 5 caveat for acuity of condition.  Patient here with palpitations, racing heart, chest tightness and shortness of breath.  Symptoms started about 1 hour ago while she was resting at home.  Found to be tachycardic in the 200s in triage.  States this is never happened before.  No history of SVT.  Denies any recent medication changes or drug use.  Denies any possibility of pregnancy.  No history of previous cardiac issues.  She does feel dizzy and lightheaded.  The history is provided by the patient. The history is limited by the condition of the patient.       Past Medical History:  Diagnosis Date  . Acute urinary retention - immediate PP 04/13/2013  . Anemia   . Anemia of mother in pregnancy, delivered with postpartum condition 04/13/2013  . History of blood transfusion     Patient Active Problem List   Diagnosis Date Noted  . Term pregnancy 08/27/2018  . Anemia 05/25/2018  . Anemia affecting pregnancy 05/07/2018  . Anemia of mother in pregnancy, delivered with postpartum condition 04/13/2013    Past Surgical History:  Procedure Laterality Date  . DILATION AND EVACUATION N/A 11/22/2015   Procedure: DILATATION AND EVACUATION;  Surgeon: Cheri Fowler, MD;  Location: [facility];  Service: Gynecology;  Laterality: N/A;  . LEEP  2009  . THERAPEUTIC ABORTION    . WISDOM TOOTH EXTRACTION  2006     OB History    Gravida  5   Para  2   Term  2   Preterm  0   AB  2   Living  2     SAB  2   TAB  0   Ectopic  0   Multiple  0   Live Births  2           No family history on file.  Social History   Tobacco Use  . Smoking status: Never Smoker  . Smokeless tobacco: Never Used  Substance Use Topics  . Alcohol use: Yes    Comment: occasionally  . Drug use:  No    Home Medications Prior to Admission medications   Medication Sig Start Date End Date Taking? Authorizing Provider  acetaminophen (TYLENOL) 500 MG tablet Take 1,000 mg by mouth every 6 (six) hours as needed for mild pain or headache.     [provider]  ferrous sulfate 325 (65 FE) MG EC tablet Take 1 tablet (325 mg total) by mouth 2 (two) times daily. 05/07/18   Tyson Dense, MD  Prenatal Vit-Fe Fumarate-FA (PRENATAL MULTIVITAMIN) TABS tablet Take 1 tablet by mouth daily at 12 noon.    [provider]    Allergies    Patient has no known allergies.  Review of Systems   Review of Systems  Constitutional: Negative for activity change and appetite change.  HENT: Negative for congestion.   Respiratory: Positive for chest tightness and shortness of breath.   Cardiovascular: Positive for palpitations.  Gastrointestinal: Negative for nausea and vomiting.  Genitourinary: Negative for dysuria and hematuria.  Musculoskeletal: Negative for arthralgias and myalgias.  Skin: Negative for rash.  Neurological: Negative for dizziness, weakness and headaches.    all other systems are negative except as noted in the HPI  and PMH.   Physical Exam Updated Vital Signs BP (!) 128/104   Pulse (!) 238   Temp 98.7 F (37.1 C) (Oral)   Resp 18   SpO2 100%   Physical Exam Vitals and nursing note reviewed.  Constitutional:      General: She is not in acute distress.    Appearance: She is well-developed.     Comments: anxious  HENT:     Head: Normocephalic and atraumatic.     Mouth/Throat:     Pharynx: No oropharyngeal exudate.  Eyes:     Conjunctiva/sclera: Conjunctivae normal.     Pupils: Pupils are equal, round, and reactive to light.  Neck:     Comments: No meningismus. Cardiovascular:     Rate and Rhythm: Regular rhythm. Tachycardia present.     Heart sounds: Normal heart sounds. No murmur.     Comments: Regular tachycardia up to 235. Pulmonary:      Effort: Pulmonary effort is normal. No respiratory distress.     Breath sounds: Normal breath sounds.  Abdominal:     Palpations: Abdomen is soft.     Tenderness: There is no abdominal tenderness. There is no guarding or rebound.  Musculoskeletal:        General: No tenderness. Normal range of motion.     Cervical back: Normal range of motion and neck supple.  Skin:    General: Skin is warm.     Capillary Refill: Capillary refill takes less than 2 seconds.  Neurological:     General: No focal deficit present.     Mental Status: She is alert and oriented to person, place, and time. Mental status is at baseline.     Cranial Nerves: No cranial nerve deficit.     Motor: No abnormal muscle tone.     Coordination: Coordination normal.     Comments: No ataxia on finger to nose bilaterally. No pronator drift. 5/5 strength throughout. CN 2-12 intact.Equal grip strength. Sensation intact.   Psychiatric:        Behavior: Behavior normal.     ED Results / Procedures / Treatments   Labs (all labs ordered are listed, but only abnormal results are displayed) Labs Reviewed  D-DIMER, QUANTITATIVE (NOT AT [facility]) - Abnormal; Notable for the following components:      Result Value   D-Dimer, Quant 0.81 (*)    All other components within normal limits  CBC WITH DIFFERENTIAL/PLATELET  COMPREHENSIVE METABOLIC PANEL  TSH  MAGNESIUM  I-STAT BETA HCG BLOOD, ED ([facility], [facility], [facility] ONLY)    EKG EKG Interpretation  Date/Time:  Wednesday October 13 2019 17:47:45 EST Ventricular Rate:  87 PR Interval:    QRS Duration: 105 QT Interval:  363 QTC Calculation: 437 R Axis:   49 Text Interpretation: Atrial fibrillation RSR' in V1 or V2, right VCD or RVH Confirmed by Ezequiel Essex [phone]) on 10/13/2019 6:22:16 PM   Radiology CT Angio Chest PE W and/or Wo Contrast  Result Date: 10/13/2019 CLINICAL DATA:  Positive D-dimer EXAM: CT ANGIOGRAPHY CHEST WITH CONTRAST TECHNIQUE: Multidetector CT imaging of the  chest was performed using the standard protocol during bolus administration of intravenous contrast. Multiplanar CT image reconstructions and MIPs were obtained to evaluate the vascular anatomy. CONTRAST:  185mL OMNIPAQUE IOHEXOL 350 MG/ML SOLN COMPARISON:  None. FINDINGS: Cardiovascular: Satisfactory opacification of the pulmonary arteries to the segmental level. No evidence of pulmonary embolism. Normal heart size. No pericardial effusion. Mediastinum/Nodes: No enlarged mediastinal, hilar, or axillary lymph nodes. Thyroid gland,  trachea, and esophagus demonstrate no significant findings. Lungs/Pleura: Lungs are clear. No pleural effusion or pneumothorax. Upper Abdomen: No acute abnormality. Musculoskeletal: No chest wall abnormality. No acute or significant osseous findings. Review of the MIP images confirms the above findings. IMPRESSION: 1. No acute cardiopulmonary disease. No evidence of pulmonary embolus. Electronically Signed   By: Kathreen Devoid   On: 10/13/2019 21:54   DG Chest Portable 1 View  Result Date: 10/13/2019 CLINICAL DATA:  Cardiac palpitations EXAM: PORTABLE CHEST 1 VIEW COMPARISON:  08/20/2019 FINDINGS: The heart size and mediastinal contours are within normal limits. Both lungs are clear. The visualized skeletal structures are unremarkable. IMPRESSION: No active disease. Electronically Signed   By: Van Clines M.D.   On: 10/13/2019 18:20    Procedures .Cardioversion  Date/Time: 10/13/2019 5:46 PM Performed by: Ezequiel Essex, MD Authorized by: Ezequiel Essex, MD   Consent:    Consent obtained:  Verbal and emergent situation   Consent given by:  Patient   Risks discussed:  Cutaneous burn, death and induced arrhythmia   Alternatives discussed:  No treatment Pre-procedure details:    Cardioversion basis:  Emergent   Rhythm:  Supraventricular tachycardia   Electrode placement:  Anterior-posterior Patient sedated: No Attempt one:    Cardioversion mode attempt one:  6 mg of adenosine.   Cardioversion outcome attempt one: conversion to atrial fibrillation. Post-procedure details:    Patient status:  Awake   Patient tolerance of procedure:  Tolerated well, no immediate complications  .Critical Care Performed by: Ezequiel Essex, MD Authorized by: Ezequiel Essex, MD   Critical care provider statement:    Critical care time (minutes):  35   Critical care was time spent personally by me on the following activities:  Discussions with consultants, evaluation of patient's response to treatment, examination of patient, ordering and performing treatments and interventions, ordering and review of laboratory studies, ordering and review of radiographic studies, pulse oximetry, re-evaluation of patient's condition, obtaining history from patient or surrogate and review of old charts   (including critical care time)  Medications Ordered in ED Medications  adenosine (ADENOCARD) 6 MG/2ML injection (has no administration in time range)  adenosine (ADENOCARD) 6 MG/2ML injection 6 mg (6 mg Intravenous Given 10/13/19 1730)    ED Course  I have reviewed the triage vital signs and the nursing notes.  Pertinent labs & imaging results that were available during my care of the patient were reviewed by me and considered in my medical decision making (see chart for details).    MDM Rules/Calculators/A&P                     Patient here with palpitations, chest tightness, shortness of breath and dizziness.  Found to be in SVT on arrival at a rate of 235.  Does have diffuse ST depressions on EKG likely rate related.  Patient given adenosine as above with conversion to rate controlled atrial fibrillation. ST changes have resolved.  Labs reassuring.  Patient remains in rate controlled atrial fibrillation.  Her CHA2DS2-VASc score is 1.  Discussed with Dr. Debara Pickett of cardiology.  He reviewed patient's EKGs.  He agrees that she is low risk for needing anticoagulation.   Recommends follow-up in atrial fibrillation clinic and EP office.  Agrees with starting low-dose beta-blocker 12.5 mg twice daily.  Would not recommend anticoagulation at this time.  This patients CHA2DS2-VASc Score and unadjusted Ischemic Stroke Rate (% per year) is equal to 0.6 % stroke rate/year from a score of  1  Above score calculated as 1 point each if present [CHF, HTN, DM, Vascular=MI/PAD/Aortic Plaque, Age if 65-74, or Female] Above score calculated as 2 points each if present [Age > 75, or Stroke/TIA/TE]   Work-up is reassuring.  Patient in no distress.  Her CT angiogram was negative for pulmonary embolism.  Remains in rate controlled atrial fibrillation in the 90s to 100s.  Discussed starting low-dose beta-blocker and following up in the atrial fibrillation clinic.  Return precautions discussed. Final Clinical Impression(s) / ED Diagnoses Final diagnoses:  SVT (supraventricular tachycardia) (HCC)  Atrial fibrillation, unspecified type [facility])    Rx / DC Orders ED Discharge Orders    None       Harlen Danford, Annie Main, MD 10/13/19 2310

## 2019-10-18 ENCOUNTER — Ambulatory Visit (HOSPITAL_COMMUNITY)
Admission: RE | Admit: 2019-10-18 | Discharge: 2019-10-18 | Disposition: A | Discharge status: Home / Self Care | Payer: Managed Care, Other (non HMO) | Source: Ambulatory Visit | Attending: Physician Assistant | Admitting: Physician Assistant

## 2019-10-18 ENCOUNTER — Other Ambulatory Visit: Payer: Self-pay

## 2019-10-18 VITALS — BP 116/80 | HR 62 | Ht 69.0 in | Wt 152.0 lb

## 2019-10-18 DIAGNOSIS — I471 Supraventricular tachycardia: Secondary | ICD-10-CM | POA: Insufficient documentation

## 2019-10-18 DIAGNOSIS — Z79899 Other long term (current) drug therapy: Secondary | ICD-10-CM | POA: Diagnosis not present

## 2019-10-18 DIAGNOSIS — I480 Paroxysmal atrial fibrillation: Secondary | ICD-10-CM | POA: Diagnosis not present

## 2019-10-18 DIAGNOSIS — R9431 Abnormal electrocardiogram [ECG] [EKG]: Secondary | ICD-10-CM | POA: Insufficient documentation

## 2019-10-18 NOTE — Progress Notes (Signed)
Primary Care Physician: Patient, No Pcp Per Primary Cardiologist: none Primary Electrophysiologist: none Referring Physician: Zacarias Pontes ER/Dr [facility]   Camary MAKEBA MILLMAN is a 34 y.o. female with a history of anemia and new onset paroxysmal atrial fibrillation who presents for consultation in the [facility].  The patient was initially diagnosed with atrial fibrillation on 10/13/19 after presenting to [facility] ER with symptoms of heart racing, palpitations, SOB, and chest tightness. Initial ECG showed SVT with HR >200. Given adenosine which converted her to rate controlled afib. Started on metoprolol on discharge. Patient reports that in hindsight she has had brief palpitations intermittently for years but always assumed it was anxiety. She denies any significant alcohol use or snoring. There were no specific triggers she could identify. Of note, she did test positive for COVID-19 in 08/2019. She reports that metoprolol makes her drowsy.   Today, she denies symptoms of chest pain, shortness of breath, orthopnea, PND, lower extremity edema, dizziness, presyncope, syncope, snoring, daytime somnolence, bleeding, or neurologic sequela. The patient is tolerating medications without difficulties and is otherwise without complaint today.    Atrial Fibrillation Risk Factors:  she does not have symptoms or diagnosis of sleep apnea. she does not have a history of rheumatic fever. she does not have a history of alcohol use. The patient does not have a history of early familial atrial fibrillation or other arrhythmias.  she has a BMI of Body mass index is 22.45 kg/m.Marland Kitchen Filed Weights   10/18/19 0856  Weight: 68.9 kg    No family history on file.   Atrial Fibrillation Management history:  Previous antiarrhythmic drugs: none Previous cardioversions: none Previous ablations: none CHADS2VASC score: 1 Anticoagulation history: none   Past Medical History:  Diagnosis  Date  . Acute urinary retention - immediate PP 04/13/2013  . Anemia   . Anemia of mother in pregnancy, delivered with postpartum condition 04/13/2013  . History of blood transfusion    Past Surgical History:  Procedure Laterality Date  . DILATION AND EVACUATION N/A 11/22/2015   Procedure: DILATATION AND EVACUATION;  Surgeon: Cheri Fowler, MD;  Location: [facility];  Service: Gynecology;  Laterality: N/A;  . LEEP  2009  . THERAPEUTIC ABORTION    . WISDOM TOOTH EXTRACTION  2006    Current Outpatient Medications  Medication Sig Dispense Refill  . metoprolol tartrate (LOPRESSOR) 25 MG tablet Take 0.5 tablets (12.5 mg total) by mouth 2 (two) times daily. 60 tablet 0   No current facility-administered medications for this encounter.    No Known Allergies  Social History   Socioeconomic History  . Marital status: Single    Spouse name: Not on file  . Number of children: Not on file  . Years of education: Not on file  . Highest education level: Not on file  Occupational History  . Not on file  Tobacco Use  . Smoking status: Never Smoker  . Smokeless tobacco: Never Used  Substance and Sexual Activity  . Alcohol use: Yes    Comment: occasionally  . Drug use: No  . Sexual activity: Yes    Birth control/protection: None  Other Topics Concern  . Not on file  Social History Narrative  . Not on file   Social Determinants of Health   Financial Resource Strain:   . Difficulty of Paying Living Expenses: Not on file  Food Insecurity:   . Worried About Charity fundraiser in the Last Year: Not on file  .  Ran Out of Food in the Last Year: Not on file  Transportation Needs:   . Lack of Transportation (Medical): Not on file  . Lack of Transportation (Non-Medical): Not on file  Physical Activity:   . Days of Exercise per Week: Not on file  . Minutes of Exercise per Session: Not on file  Stress:   . Feeling of Stress : Not on file  Social Connections:   . Frequency of Communication  with Friends and Family: Not on file  . Frequency of Social Gatherings with Friends and Family: Not on file  . Attends Religious Services: Not on file  . Active Member of Clubs or Organizations: Not on file  . Attends Archivist Meetings: Not on file  . Marital Status: Not on file  Intimate Partner Violence:   . Fear of Current or Ex-Partner: Not on file  . Emotionally Abused: Not on file  . Physically Abused: Not on file  . Sexually Abused: Not on file     ROS- All systems are reviewed and negative except as per the HPI above.  Physical Exam: Vitals:   10/18/19 0856  BP: 116/80  Pulse: 62  Weight: 68.9 kg  Height: 5\' 9"  (1.753 m)    GEN- The patient is well appearing, alert and oriented x 3 today.   Head- normocephalic, atraumatic Eyes-  Sclera clear, conjunctiva pink Ears- hearing intact Oropharynx- clear Neck- supple  Lungs- Clear to ausculation bilaterally, normal work of breathing Heart- Regular rate and rhythm, no murmurs, rubs or gallops  GI- soft, NT, ND, + BS Extremities- no clubbing, cyanosis, or edema MS- no significant deformity or atrophy Skin- no rash or lesion Psych- euthymic mood, full affect Neuro- strength and sensation are intact  Wt Readings from Last 3 Encounters:  10/18/19 68.9 kg  08/20/19 63.5 kg  08/27/18 86.5 kg    EKG today demonstrates SR HR 62, PVC, PR 144, QRS 84, QTc 452   Epic records are reviewed at length today  Assessment and Plan:  1. Paroxysmal atrial fibrillation/SVT General education about afib provided and questions answered.  We also discussed her stroke risk and the risks and benefits of anticoagulation. No anticoagulation indicated at this point. Check echocardiogram. Decrease Lopressor to 12.5 mg PRN q6hrs for heart racing.  This patients CHA2DS2-VASc Score and unadjusted Ischemic Stroke Rate (% per year) is equal to 0.6 % stroke rate/year from a score of 1  Above score calculated as 1 point each if  present [CHF, HTN, DM, Vascular=MI/PAD/Aortic Plaque, Age if 65-74, or Female] Above score calculated as 2 points each if present [Age > 75, or Stroke/TIA/TE]    Follow up in the AF clinic in one month.   [facility] [address] [place], [place] [address] [phone] 10/18/2019 2:58 PM

## 2019-10-22 ENCOUNTER — Ambulatory Visit (HOSPITAL_COMMUNITY): Admission: RE | Admit: 2019-10-22 | Payer: Managed Care, Other (non HMO) | Source: Ambulatory Visit

## 2019-11-17 MED ORDER — METOPROLOL TARTRATE 25 MG PO TABS: ORAL_TABLET | ORAL | 0 refills | Status: DC

## 2019-11-17 NOTE — Addendum Note (Signed)
Encounter addended by: Enid Derry, CMA on: 11/17/2019 4:26 PM  Actions taken: Order list changed

## 2019-11-18 ENCOUNTER — Encounter (HOSPITAL_COMMUNITY): Payer: Self-pay | Admitting: *Deleted

## 2019-11-18 ENCOUNTER — Ambulatory Visit (HOSPITAL_COMMUNITY): Payer: Managed Care, Other (non HMO) | Admitting: Physician Assistant

## 2020-04-27 ENCOUNTER — Other Ambulatory Visit: Payer: Self-pay

## 2020-04-27 ENCOUNTER — Ambulatory Visit (HOSPITAL_COMMUNITY)
Admission: RE | Admit: 2020-04-27 | Discharge: 2020-04-27 | Disposition: A | Discharge status: Home / Self Care | Payer: 59 | Source: Ambulatory Visit | Attending: Physician Assistant | Admitting: Physician Assistant

## 2020-04-27 ENCOUNTER — Encounter (HOSPITAL_COMMUNITY): Payer: Self-pay | Admitting: Physician Assistant

## 2020-04-27 VITALS — BP 122/70 | HR 78 | Ht 69.0 in | Wt 156.6 lb

## 2020-04-27 DIAGNOSIS — I480 Paroxysmal atrial fibrillation: Secondary | ICD-10-CM | POA: Diagnosis not present

## 2020-04-27 DIAGNOSIS — D649 Anemia, unspecified: Secondary | ICD-10-CM | POA: Diagnosis not present

## 2020-04-27 DIAGNOSIS — R002 Palpitations: Secondary | ICD-10-CM | POA: Insufficient documentation

## 2020-04-27 NOTE — Progress Notes (Signed)
Primary Care Physician: Patient, No Pcp Per Primary Cardiologist: none Primary Electrophysiologist: none Referring Physician: Zacarias Pontes ER/Dr [facility]   Ashley Shaw is a 34 y.o. female with a history of anemia and new onset paroxysmal atrial fibrillation who presents for follow up in the [facility].  The patient was initially diagnosed with atrial fibrillation on 10/13/19 after presenting to [facility] ER with symptoms of heart racing, palpitations, SOB, and chest tightness. Initial ECG showed SVT with HR >200. Given adenosine which converted her to rate controlled afib. Started on metoprolol on discharge. Patient reports that in hindsight she has had brief palpitations intermittently for years but always assumed it was anxiety. She denies any significant alcohol use or snoring. There were no specific triggers she could identify. Of note, she did test positive for COVID-19 in 08/2019. She reports that metoprolol makes her drowsy.   On follow up today, patient reports that she has done well since her last visit until this past week when she noticed increased palpitations at night. She feels her heart racing until she falls asleep and it is resolved in the morning. She has taken her PRN BB once which resolved her symptoms. She does admit to recent stress with moving homes this weekend.    Today, she denies symptoms of chest pain, shortness of breath, orthopnea, PND, lower extremity edema, dizziness, presyncope, syncope, snoring, daytime somnolence, bleeding, or neurologic sequela. The patient is tolerating medications without difficulties and is otherwise without complaint today.    Atrial Fibrillation Risk Factors:  she does not have symptoms or diagnosis of sleep apnea. she does not have a history of rheumatic fever. she does not have a history of alcohol use. The patient does not have a history of early familial atrial fibrillation or other arrhythmias.  she  has a BMI of Body mass index is 23.13 kg/m.Marland Kitchen Filed Weights   04/27/20 1414  Weight: 71 kg    No family history on file.   Atrial Fibrillation Management history:  Previous antiarrhythmic drugs: none Previous cardioversions: none Previous ablations: none CHADS2VASC score: 1 Anticoagulation history: none   Past Medical History:  Diagnosis Date  . Acute urinary retention - immediate PP 04/13/2013  . Anemia   . Anemia of mother in pregnancy, delivered with postpartum condition 04/13/2013  . History of blood transfusion    Past Surgical History:  Procedure Laterality Date  . DILATION AND EVACUATION N/A 11/22/2015   Procedure: DILATATION AND EVACUATION;  Surgeon: Cheri Fowler, MD;  Location: [facility];  Service: Gynecology;  Laterality: N/A;  . LEEP  2009  . THERAPEUTIC ABORTION    . WISDOM TOOTH EXTRACTION  2006    Current Outpatient Medications  Medication Sig Dispense Refill  . metoprolol tartrate (LOPRESSOR) 25 MG tablet Take 1/2 tablet by mouth every 6 hours prn for heart racing. 60 tablet 0   No current facility-administered medications for this encounter.    No Known Allergies  Social History   Socioeconomic History  . Marital status: Single    Spouse name: Not on file  . Number of children: Not on file  . Years of education: Not on file  . Highest education level: Not on file  Occupational History  . Not on file  Tobacco Use  . Smoking status: Never Smoker  . Smokeless tobacco: Never Used  Substance and Sexual Activity  . Alcohol use: Yes    Comment: occasionally  . Drug use: No  . Sexual  activity: Yes    Birth control/protection: None  Other Topics Concern  . Not on file  Social History Narrative  . Not on file   Social Determinants of Health   Financial Resource Strain:   . Difficulty of Paying Living Expenses:   Food Insecurity:   . Worried About Charity fundraiser in the Last Year:   . Arboriculturist in the Last Year:   Transportation Needs:    . Film/video editor (Medical):   Marland Kitchen Lack of Transportation (Non-Medical):   Physical Activity:   . Days of Exercise per Week:   . Minutes of Exercise per Session:   Stress:   . Feeling of Stress :   Social Connections:   . Frequency of Communication with Friends and Family:   . Frequency of Social Gatherings with Friends and Family:   . Attends Religious Services:   . Active Member of Clubs or Organizations:   . Attends Archivist Meetings:   Marland Kitchen Marital Status:   Intimate Partner Violence:   . Fear of Current or Ex-Partner:   . Emotionally Abused:   Marland Kitchen Physically Abused:   . Sexually Abused:      ROS- All systems are reviewed and negative except as per the HPI above.  Physical Exam: Vitals:   04/27/20 1414  BP: 122/70  Pulse: 78  Weight: 71 kg  Height: 5\' 9"  (1.753 m)    GEN- The patient is well appearing, alert and oriented x 3 today.   HEENT-head normocephalic, atraumatic, sclera clear, conjunctiva pink, hearing intact, trachea midline. Lungs- Clear to ausculation bilaterally, normal work of breathing Heart- Regular rate and rhythm, no murmurs, rubs or gallops  GI- soft, NT, ND, + BS Extremities- no clubbing, cyanosis, or edema MS- no significant deformity or atrophy Skin- no rash or lesion Psych- euthymic mood, full affect Neuro- strength and sensation are intact   Wt Readings from Last 3 Encounters:  04/27/20 71 kg  10/18/19 68.9 kg  08/20/19 63.5 kg    EKG today demonstrates SR HR 78, PR 118, QRS 94, QTc 446  Epic records are reviewed at length today  Assessment and Plan:  1. Paroxysmal atrial fibrillation/SVT Patient having nocturnal palpitations. Patient does have some anxiety/stress about moving.  Will have her continue Lopressor 12.5 mg PRN. If patient continues to have symptoms after they have relocated, would consider heart monitor to evaluate palpitations.  No anticoagulation indicated at this point. Echocardiogram ordered but  not completed.  This patients CHA2DS2-VASc Score and unadjusted Ischemic Stroke Rate (% per year) is equal to 0.6 % stroke rate/year from a score of 1  Above score calculated as 1 point each if present [CHF, HTN, DM, Vascular=MI/PAD/Aortic Plaque, Age if 65-74, or Female] Above score calculated as 2 points each if present [Age > 75, or Stroke/TIA/TE]    Follow up in the AF clinic in 2 months. Sooner if palpitations persist for heart monitor placement.    [facility] [address] [place], [place] [address] [phone] 04/27/2020 2:41 PM

## 2020-06-09 ENCOUNTER — Inpatient Hospital Stay (HOSPITAL_COMMUNITY): Payer: 59

## 2020-06-09 ENCOUNTER — Other Ambulatory Visit: Payer: Self-pay

## 2020-06-09 ENCOUNTER — Encounter (HOSPITAL_COMMUNITY): Payer: Self-pay | Admitting: Obstetrics and Gynecology

## 2020-06-09 ENCOUNTER — Inpatient Hospital Stay (HOSPITAL_COMMUNITY)
Admission: AD | Admit: 2020-06-09 | Discharge: 2020-06-09 | Disposition: A | Discharge status: Home / Self Care | Payer: 59 | Attending: Obstetrics and Gynecology | Admitting: Obstetrics and Gynecology

## 2020-06-09 DIAGNOSIS — O3411 Maternal care for benign tumor of corpus uteri, first trimester: Secondary | ICD-10-CM | POA: Diagnosis not present

## 2020-06-09 DIAGNOSIS — D259 Leiomyoma of uterus, unspecified: Secondary | ICD-10-CM | POA: Diagnosis not present

## 2020-06-09 DIAGNOSIS — Z3A01 Less than 8 weeks gestation of pregnancy: Secondary | ICD-10-CM | POA: Diagnosis not present

## 2020-06-09 DIAGNOSIS — O209 Hemorrhage in early pregnancy, unspecified: Secondary | ICD-10-CM | POA: Insufficient documentation

## 2020-06-09 DIAGNOSIS — Z3490 Encounter for supervision of normal pregnancy, unspecified, unspecified trimester: Secondary | ICD-10-CM

## 2020-06-09 DIAGNOSIS — Z79899 Other long term (current) drug therapy: Secondary | ICD-10-CM | POA: Insufficient documentation

## 2020-06-09 LAB — CBC
HCT: 38.6 % (ref 36.0–46.0)
Hemoglobin: 12.7 g/dL (ref 12.0–15.0)
MCH: 29.4 pg (ref 26.0–34.0)
MCHC: 32.9 g/dL (ref 30.0–36.0)
MCV: 89.4 fL (ref 80.0–100.0)
Platelets: 197 10*3/uL (ref 150–400)
RBC: 4.32 MIL/uL (ref 3.87–5.11)
RDW: 13.6 % (ref 11.5–15.5)
WBC: 5.5 10*3/uL (ref 4.0–10.5)
nRBC: 0 % (ref 0.0–0.2)

## 2020-06-09 LAB — URINALYSIS, ROUTINE W REFLEX MICROSCOPIC
Bilirubin Urine: NEGATIVE
Glucose, UA: NEGATIVE mg/dL
Ketones, ur: NEGATIVE mg/dL
Nitrite: NEGATIVE
Protein, ur: NEGATIVE mg/dL
Specific Gravity, Urine: 1.017 (ref 1.005–1.030)
pH: 5 (ref 5.0–8.0)

## 2020-06-09 LAB — ABO/RH: ABO/RH(D): O POS

## 2020-06-09 LAB — WET PREP, GENITAL
Clue Cells Wet Prep HPF POC: NONE SEEN
Sperm: NONE SEEN
Trich, Wet Prep: NONE SEEN
Yeast Wet Prep HPF POC: NONE SEEN

## 2020-06-09 LAB — HCG, QUANTITATIVE, PREGNANCY: hCG, Beta Chain, Quant, S: 6605 m[IU]/mL — ABNORMAL HIGH (ref <5)

## 2020-06-09 LAB — POCT PREGNANCY, URINE: Preg Test, Ur: POSITIVE — AB

## 2020-06-09 LAB — HIV ANTIBODY (ROUTINE TESTING W REFLEX): HIV Screen 4th Generation wRfx: NONREACTIVE

## 2020-06-09 NOTE — MAU Provider Note (Signed)
History     CSN: [id number]  Arrival date and time: 06/09/20 3790   First Provider Initiated Contact with Patient 06/09/20 [phone]      Chief Complaint  Patient presents with  . Vaginal Bleeding   HPI Ashley Shaw is a 33 y.o. [id number] at [redacted]w[redacted]d by certain LMP who presents to MAU with chief complaint of vaginal bleeding. This is a new problem. Patient endorses heavy vaginal bleeding Wednesday 06/07/2020 which lightened to pink spotting Thursday 08/26. She has not visualized any bleeding today. She has not experienced pain at any time. She denies dysuria, abdominal tenderness, fever or recent illness. Most recent sexual intercourse Monday 06/05/2020.  Patient has established care with [facility].  OB History    Gravida  6   Para  2   Term  2   Preterm  0   AB  3   Living  2     SAB  3   TAB  0   Ectopic  0   Multiple  0   Live Births  2           Past Medical History:  Diagnosis Date  . Acute urinary retention - immediate PP 04/13/2013  . Anemia   . Anemia of mother in pregnancy, delivered with postpartum condition 04/13/2013  . History of blood transfusion     Past Surgical History:  Procedure Laterality Date  . DILATION AND EVACUATION N/A 11/22/2015   Procedure: DILATATION AND EVACUATION;  Surgeon: Cheri Fowler, MD;  Location: [facility];  Service: Gynecology;  Laterality: N/A;  . LEEP  2009  . THERAPEUTIC ABORTION    . [place] EXTRACTION  2006    History reviewed. No pertinent family history.  Social History   Tobacco Use  . Smoking status: Never Smoker  . Smokeless tobacco: Never Used  Vaping Use  . Vaping Use: Never used  Substance Use Topics  . Alcohol use: Yes    Comment: occasionally  . Drug use: No    Allergies: No Known Allergies  Medications Prior to Admission  Medication Sig Dispense Refill Last Dose  . metoprolol tartrate (LOPRESSOR) 25 MG tablet Take 1/2 tablet by mouth every 6 hours prn for heart racing. 60 tablet  0 06/08/2020 at Unknown time    Review of Systems  Gastrointestinal: Negative for abdominal pain.  Genitourinary: Positive for vaginal bleeding.  All other systems reviewed and are negative.  Physical Exam   Blood pressure 134/79, pulse 74, temperature 99.1 F (37.3 C), temperature source Oral, resp. rate 18, last menstrual period 05/06/2020, SpO2 100 %, not currently breastfeeding.  Physical Exam Vitals and nursing note reviewed. Exam conducted with a chaperone present.  Constitutional:      Appearance: Normal appearance.  Cardiovascular:     Rate and Rhythm: Normal rate.     Pulses: Normal pulses.     Heart sounds: Normal heart sounds.  Pulmonary:     Effort: Pulmonary effort is normal.     Breath sounds: Normal breath sounds.  Abdominal:     General: Abdomen is flat.     Tenderness: There is no abdominal tenderness. There is no right CVA tenderness or left CVA tenderness.  Skin:    General: Skin is warm and dry.     Capillary Refill: Capillary refill takes less than 2 seconds.  Neurological:     General: No focal deficit present.     Mental Status: She is alert.  Psychiatric:  Mood and Affect: Mood normal.     MAU Course  Procedures: speculum exam, TVUS  Orders Placed This Encounter  Procedures  . Wet prep, genital    Standing Status:   Standing    Number of Occurrences:   1  . US OB LESS THAN 14 WEEKS WITH OB TRANSVAGINAL    Standing Status:   Standing    Number of Occurrences:   1    Order Specific Question:   Symptom/Reason for Exam    Answer:   Vaginal bleeding in pregnancy, first trimester [[id number]]  . Urinalysis, Routine w reflex microscopic Urine, Clean Catch    Standing Status:   Standing    Number of Occurrences:   1  . CBC    Standing Status:   Standing    Number of Occurrences:   1  . hCG, quantitative, pregnancy    Standing Status:   Standing    Number of Occurrences:   1  . HIV Antibody (routine testing w rflx)    Standing Status:    Standing    Number of Occurrences:   1  . Pregnancy, urine POC    Standing Status:   Standing    Number of Occurrences:   1  . ABO/Rh    Standing Status:   Standing    Number of Occurrences:   1   Patient Vitals for the past 24 hrs:  BP Temp Temp src Pulse Resp SpO2  06/09/20 1132 128/81 -- -- -- -- --  06/09/20 0914 134/79 99.1 F (37.3 C) Oral 74 18 100 %   Results for orders placed or performed during the hospital encounter of 06/09/20 (from the past 24 hour(s))  Pregnancy, urine POC     Status: Abnormal   Collection Time: 06/09/20  8:55 AM  Result Value Ref Range   Preg Test, Ur POSITIVE (A) NEGATIVE  Urinalysis, Routine w reflex microscopic Urine, Clean Catch     Status: Abnormal   Collection Time: 06/09/20  8:58 AM  Result Value Ref Range   Color, Urine YELLOW YELLOW   APPearance HAZY (A) CLEAR   Specific Gravity, Urine 1.017 1.005 - 1.030   pH 5.0 5.0 - 8.0   Glucose, UA NEGATIVE NEGATIVE mg/dL   Hgb urine dipstick SMALL (A) NEGATIVE   Bilirubin Urine NEGATIVE NEGATIVE   Ketones, ur NEGATIVE NEGATIVE mg/dL   Protein, ur NEGATIVE NEGATIVE mg/dL   Nitrite NEGATIVE NEGATIVE   Leukocytes,Ua SMALL (A) NEGATIVE   RBC / HPF 0-5 0 - 5 RBC/hpf   WBC, UA 0-5 0 - 5 WBC/hpf   Bacteria, UA RARE (A) NONE SEEN   Squamous Epithelial / LPF 6-10 0 - 5   Mucus PRESENT   Wet prep, genital     Status: Abnormal   Collection Time: 06/09/20 10:02 AM   Specimen: PATH Cytology Cervicovaginal Ancillary Only  Result Value Ref Range   Yeast Wet Prep HPF POC NONE SEEN NONE SEEN   Trich, Wet Prep NONE SEEN NONE SEEN   Clue Cells Wet Prep HPF POC NONE SEEN NONE SEEN   WBC, Wet Prep HPF POC MANY (A) NONE SEEN   Sperm NONE SEEN   CBC     Status: None   Collection Time: 06/09/20 10:32 AM  Result Value Ref Range   WBC 5.5 4.0 - 10.5 K/uL   RBC 4.32 3.87 - 5.11 MIL/uL   Hemoglobin 12.7 12.0 - 15.0 g/dL   HCT 38.6 36 - 46 %  MCV 89.4 80.0 - 100.0 fL   MCH 29.4 26.0 - 34.0 pg   MCHC 32.9  30.0 - 36.0 g/dL   RDW 13.6 11.5 - 15.5 %   Platelets 197 150 - 400 K/uL   nRBC 0.0 0.0 - 0.2 %  hCG, quantitative, pregnancy     Status: Abnormal   Collection Time: 06/09/20 10:32 AM  Result Value Ref Range   hCG, Beta Chain, Quant, S 6,605 (H) <5 mIU/mL  HIV Antibody (routine testing w rflx)     Status: None   Collection Time: 06/09/20 10:32 AM  Result Value Ref Range   HIV Screen 4th Generation wRfx Non Reactive Non Reactive  ABO/Rh     Status: None   Collection Time: 06/09/20 10:32 AM  Result Value Ref Range   ABO/RH(D) O POS    No rh immune globuloin      NOT A RH IMMUNE GLOBULIN CANDIDATE, PT RH POSITIVE Performed at [facility], [address], [place], [place] [address]    US OB LESS THAN 14 WEEKS WITH OB TRANSVAGINAL  Result Date: 06/09/2020 CLINICAL DATA:  Vaginal bleeding in early pregnancy. EXAM: OBSTETRIC <14 WK [place] AND TRANSVAGINAL OB US TECHNIQUE: Both transabdominal and transvaginal ultrasound examinations were performed for complete evaluation of the gestation as well as the maternal uterus, adnexal regions, and pelvic cul-de-sac. Transvaginal technique was performed to assess early pregnancy. COMPARISON:  None. FINDINGS: Intrauterine gestational sac: Single Yolk sac:  Visualized. Embryo:  Not Visualized. MSD: 7 mm   5 w   3 d Subchorionic hemorrhage:  None visualized. Maternal uterus/adnexae: Several small uterine fibroids are seen, largest measuring 3.5 cm. Both ovaries are normal appearance. No adnexal mass or abnormal free fluid identified. IMPRESSION: Single intrauterine gestational sac measuring 5 weeks 3 days by MSD. Suggest correlation with serial b-hCG levels, and consider followup ultrasound to assess viability in 10 days. Several small uterine fibroids, largest measuring 3.5 cm. Electronically Signed   By: Marlaine Hind M.D.   On: 06/09/2020 11:10   Assessment and Plan  --34 y.o. [id number] with SIUP at [redacted]w[redacted]d  --Multiple small fibroids noted --Abnormal UA,  squam epi noted, culture ordered --Blood type O POS --Discharge home in stable condition  F/U: --Patient has New OB appt with [facility], CNM 06/09/2020, 12:49 PM

## 2020-06-09 NOTE — Discharge Instructions (Signed)

## 2020-06-09 NOTE — MAU Note (Signed)
Pt presents to MAU with c/o VB that started yesterday, she states that she soaked through her pants and following had light pink spotting that. Today she has had no bleeding. She denies pain. LMP-05/03/20 Had +HPT on Friday 06/02/2020

## 2020-06-10 LAB — CULTURE, OB URINE: Culture: <10000 — AB

## 2020-06-12 LAB — GC/CHLAMYDIA PROBE AMP (~~LOC~~) NOT AT ARMC
Chlamydia: NEGATIVE
Comment: NEGATIVE
Comment: NORMAL
Neisseria Gonorrhea: NEGATIVE

## 2020-06-19 IMAGING — CT CT ANGIO CHEST
2 of 7 series · 19 of 46 positions shown · IV contrast (APPLIED)
Comparison: None.

CLINICAL DATA: Positive D-dimer

EXAM:
CT ANGIOGRAPHY CHEST WITH CONTRAST
TECHNIQUE: Multidetector CT imaging of the chest was performed using the
standard protocol during bolus administration of intravenous
contrast. Multiplanar CT image reconstructions and MIPs were
obtained to evaluate the vascular anatomy.
CONTRAST:  100mL OMNIPAQUE IOHEXOL 350 MG/ML SOLN

[Series 8: thins · axial · 0.65mm/px · z∈[+1034,+1281]mm · 16 of 397 slices shown]
[im 23/397  lung]
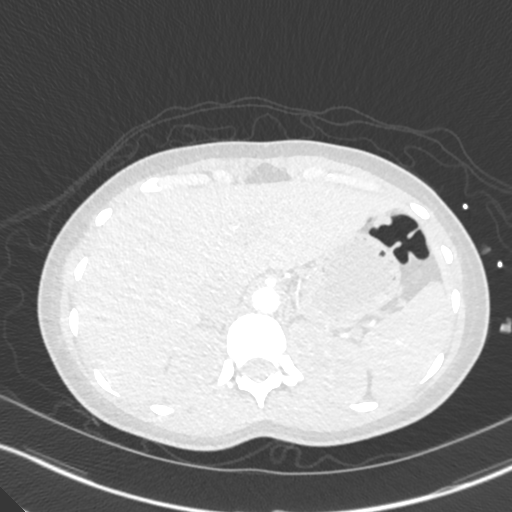
[im 45/397  soft-tissue]
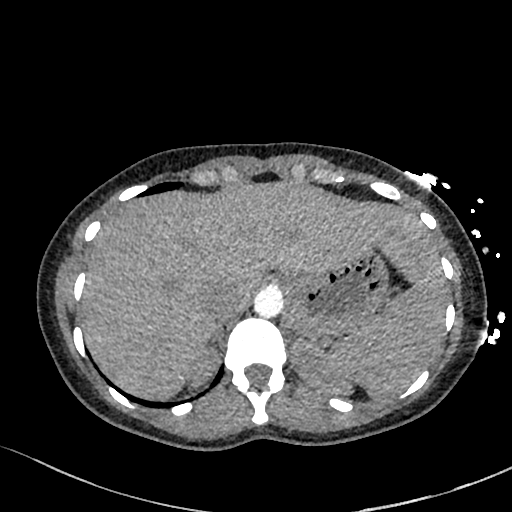
[im 67/397  lung]
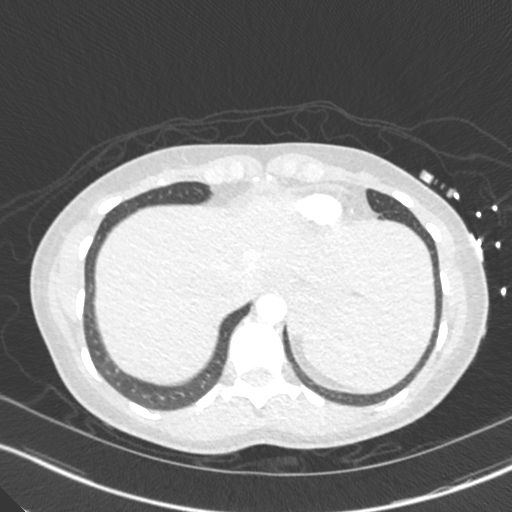
[im 89/397  soft-tissue]
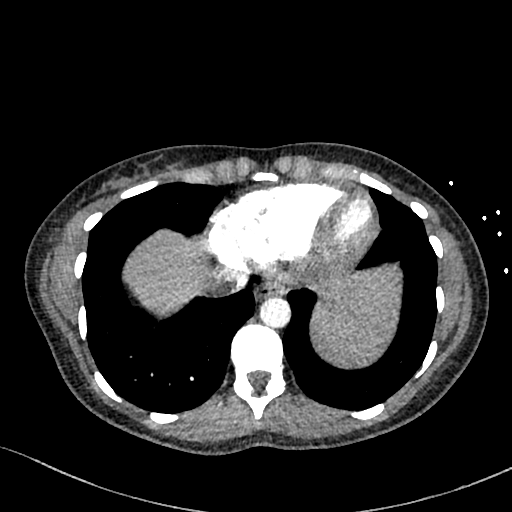
[im 111/397  lung]
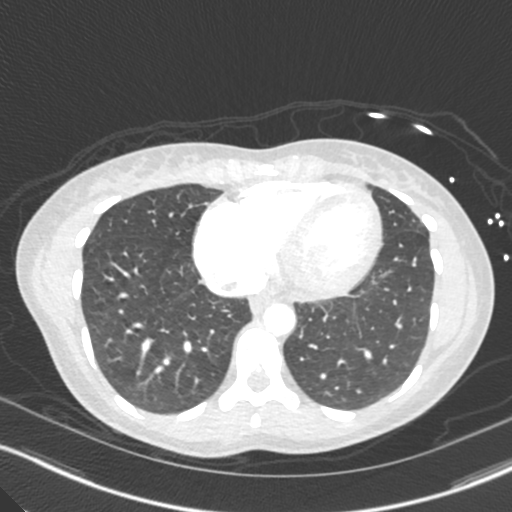
[im 133/397  soft-tissue]
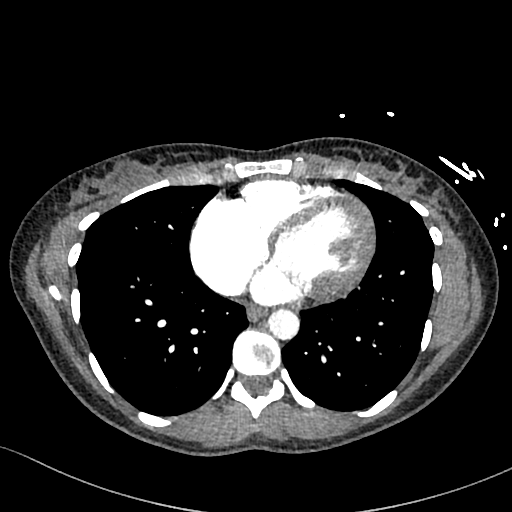
[im 155/397  lung]
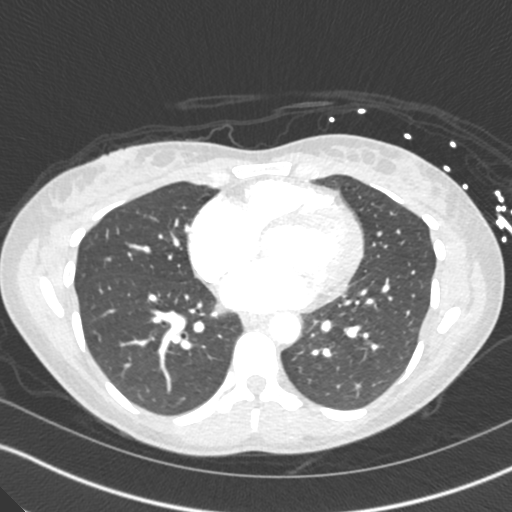
[im 177/397  soft-tissue]
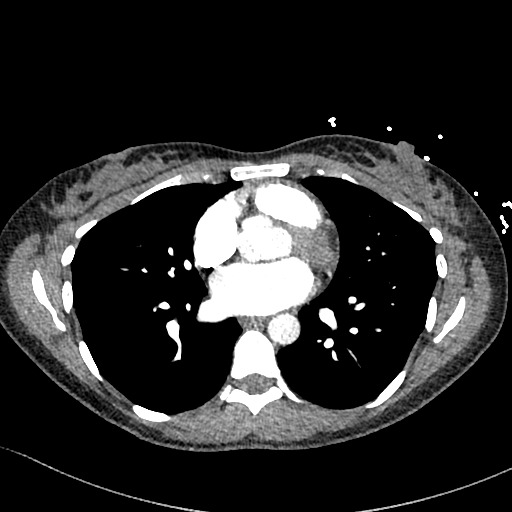
[im 221/397  lung]
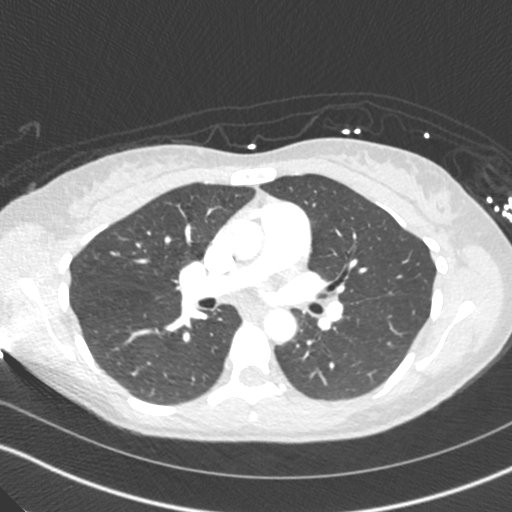
[im 243/397  soft-tissue]
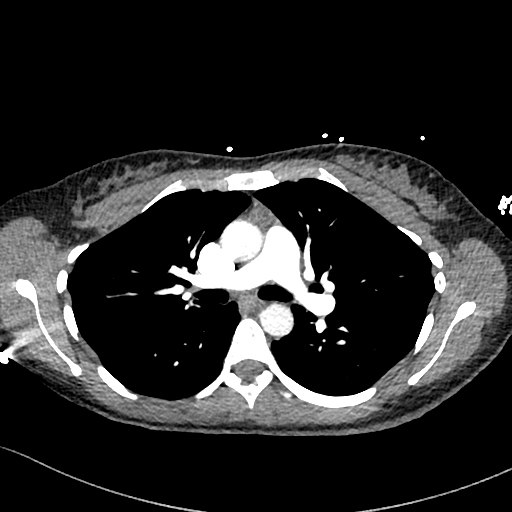
[im 265/397  lung]
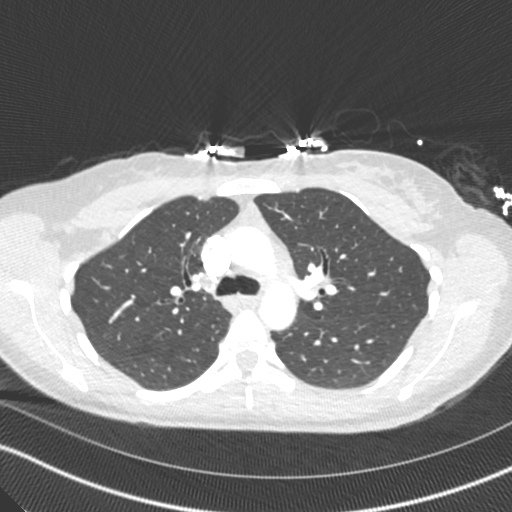
[im 287/397  soft-tissue]
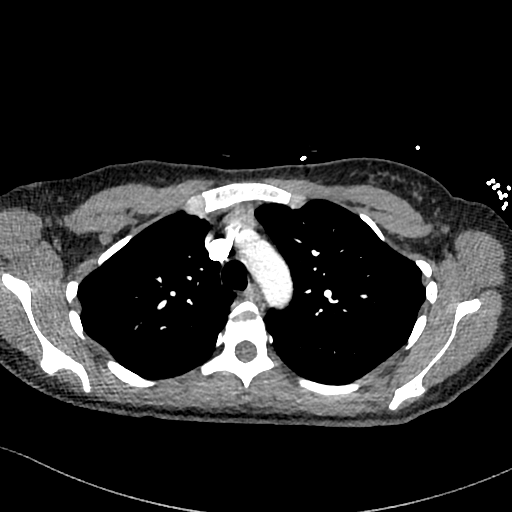
[im 309/397  lung]
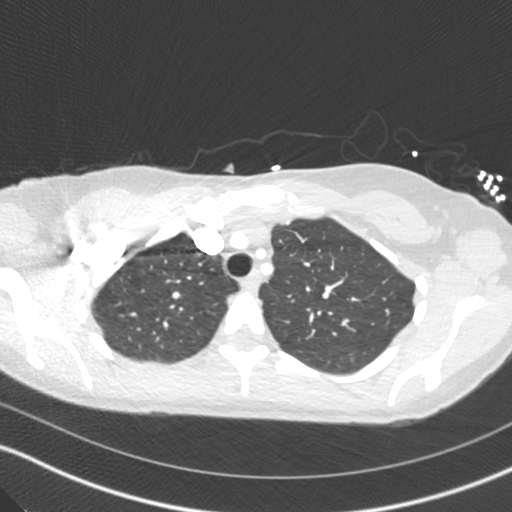
[im 331/397  soft-tissue]
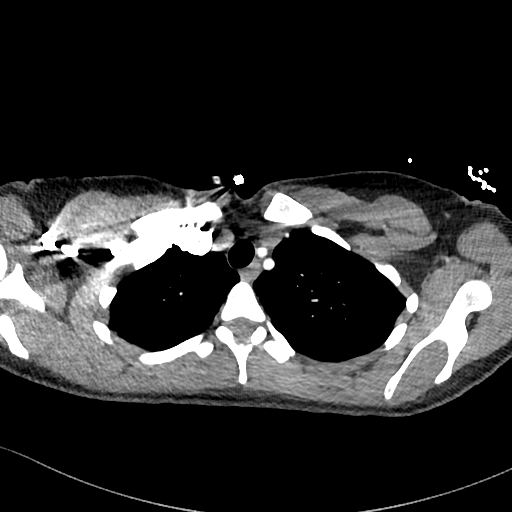
[im 353/397  lung]
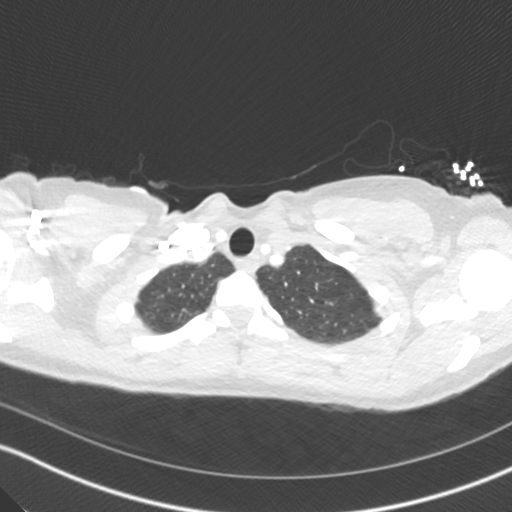
[im 375/397  soft-tissue]
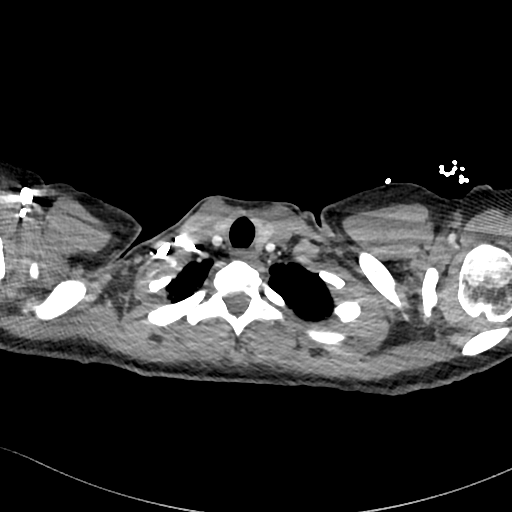

[Series 9: cor · coronal · 0.58mm/px · 3 of 107 slices shown]
[im 27/107  soft-tissue]
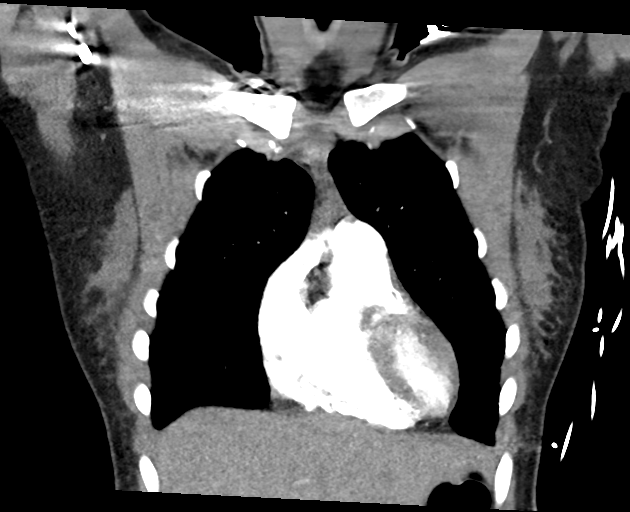
[im 54/107  soft-tissue]
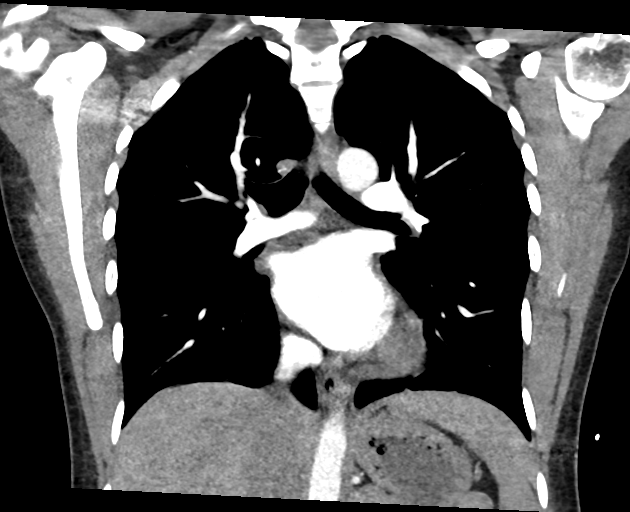
[im 80/107  soft-tissue]
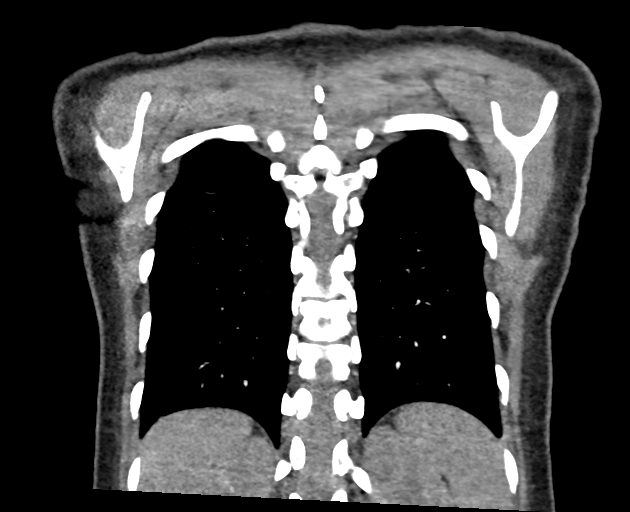

[19 of 46 positions shown; findings below may reference images not displayed]

FINDINGS: Cardiovascular: Satisfactory opacification of the pulmonary arteries
to the segmental level. No evidence of pulmonary embolism. Normal
heart size. No pericardial effusion.

Mediastinum/Nodes: No enlarged mediastinal, hilar, or axillary lymph
nodes. Thyroid gland, trachea, and esophagus demonstrate no
significant findings.

Lungs/Pleura: Lungs are clear. No pleural effusion or pneumothorax.

Upper Abdomen: No acute abnormality.

Musculoskeletal: No chest wall abnormality. No acute or significant
osseous findings.

Review of the MIP images confirms the above findings.
IMPRESSION: 1. No acute cardiopulmonary disease. No evidence of pulmonary
embolus.

## 2020-06-29 ENCOUNTER — Encounter (HOSPITAL_COMMUNITY): Payer: Self-pay

## 2020-06-29 ENCOUNTER — Ambulatory Visit (HOSPITAL_COMMUNITY): Payer: 59 | Admitting: Physician Assistant

## 2021-02-07 ENCOUNTER — Other Ambulatory Visit (HOSPITAL_COMMUNITY): Payer: Self-pay | Admitting: *Deleted

## 2021-02-07 MED ORDER — METOPROLOL TARTRATE 25 MG PO TABS: ORAL_TABLET | ORAL | 0 refills | Status: DC

## 2021-02-14 IMAGING — US US OB < 14 WEEKS - US OB TV
1 series · 15 of 28 positions shown · non-contrast
Comparison: None.

CLINICAL DATA: Vaginal bleeding in early pregnancy.

EXAM:
OBSTETRIC <14 WK US AND TRANSVAGINAL OB US
TECHNIQUE: Both transabdominal and transvaginal ultrasound examinations were
performed for complete evaluation of the gestation as well as the
maternal uterus, adnexal regions, and pelvic cul-de-sac.
Transvaginal technique was performed to assess early pregnancy.

[Series 1: us ob < 14 weeks - us ob tv · 15 of 93 slices shown]
[im 1/93]
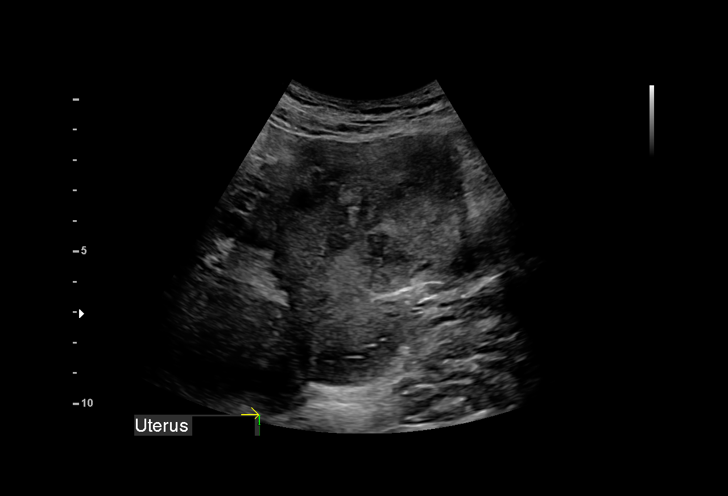
[im 7/93]
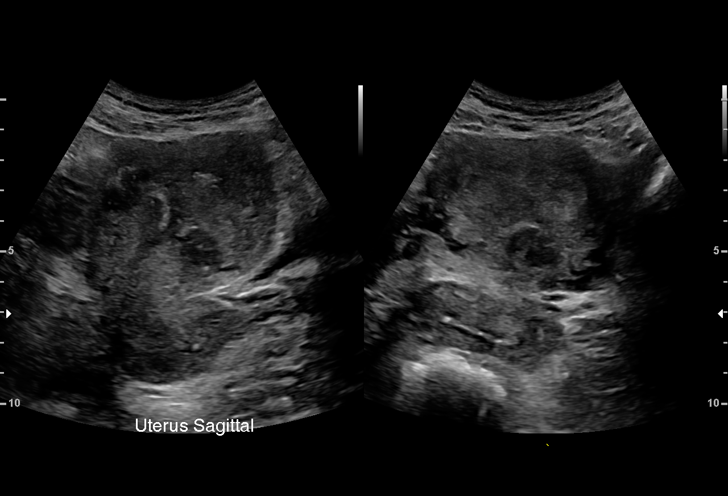
[im 14/93]
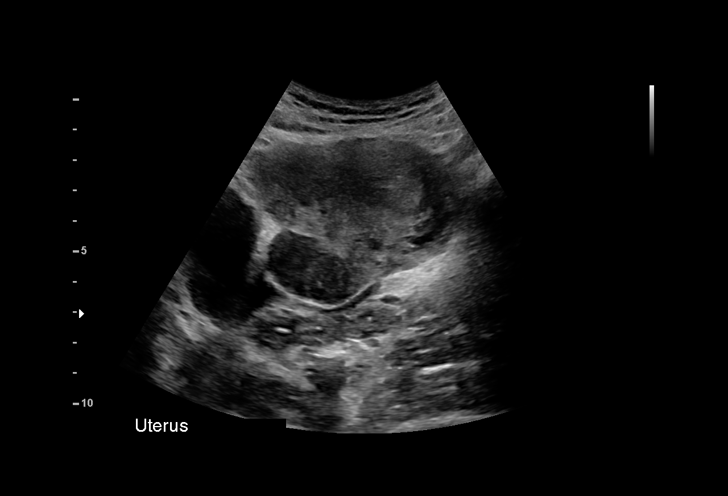
[im 21/93]
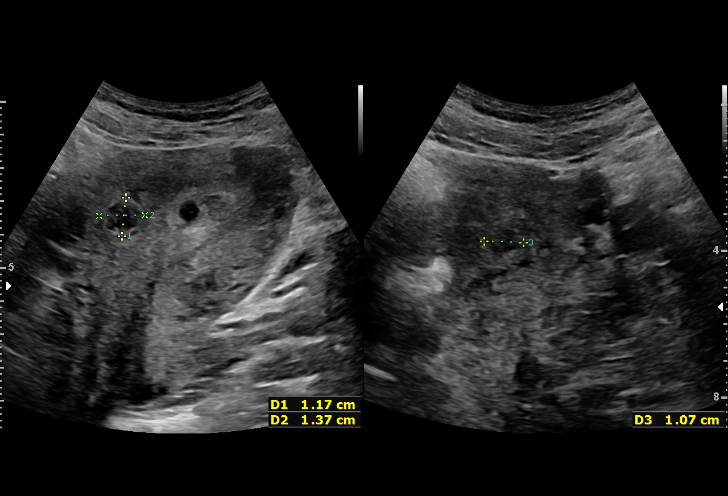
[im 28/93]
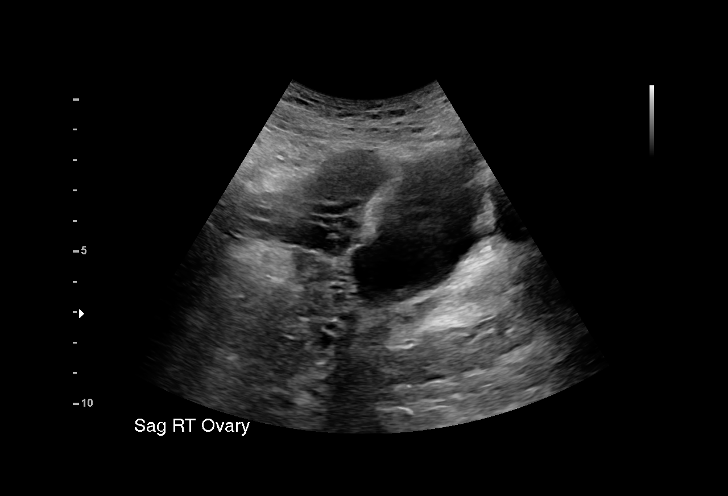
[im 35/93]
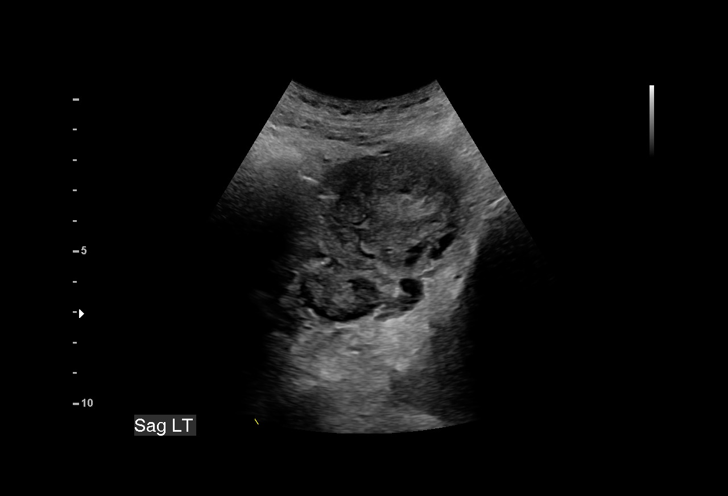
[im 41/93]
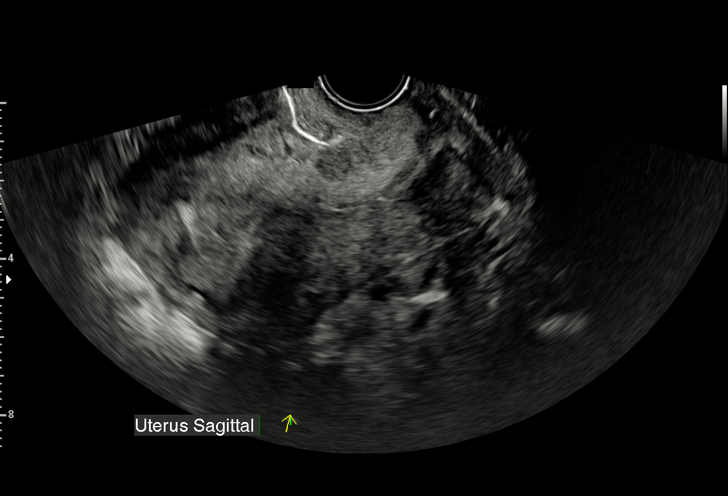
[im 48/93]
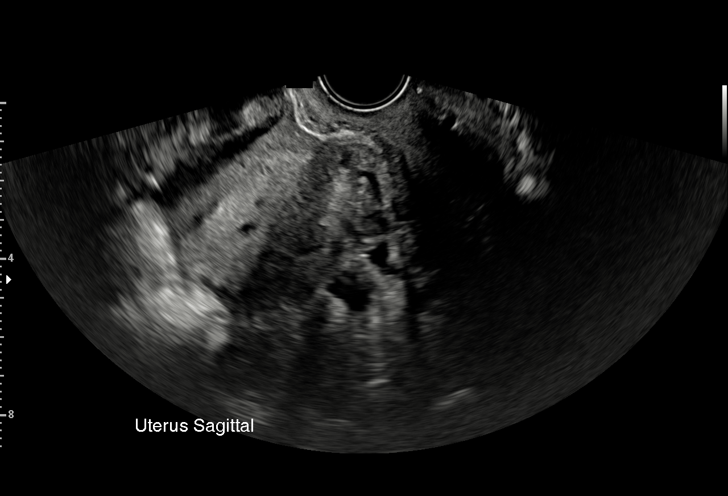
[im 52/93]
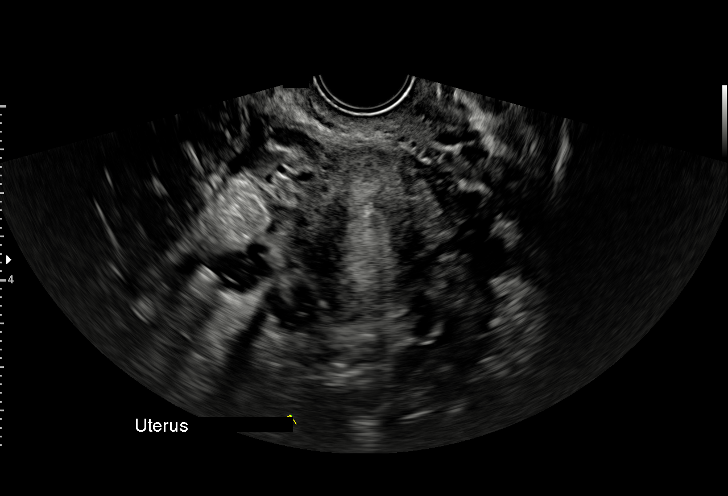
[im 58/93]
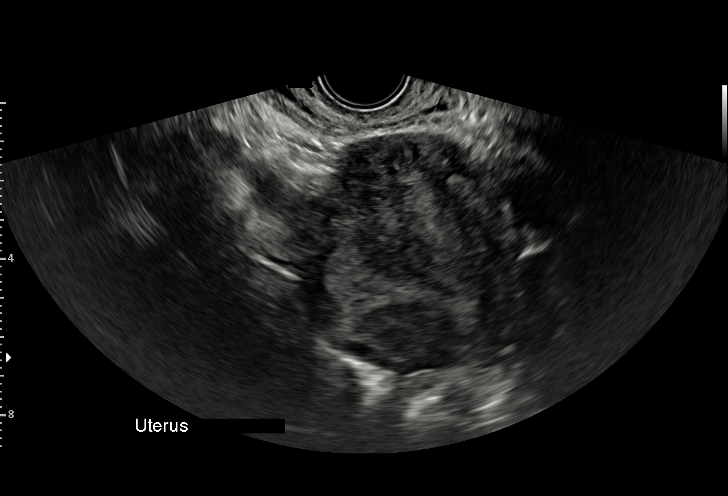
[im 65/93]
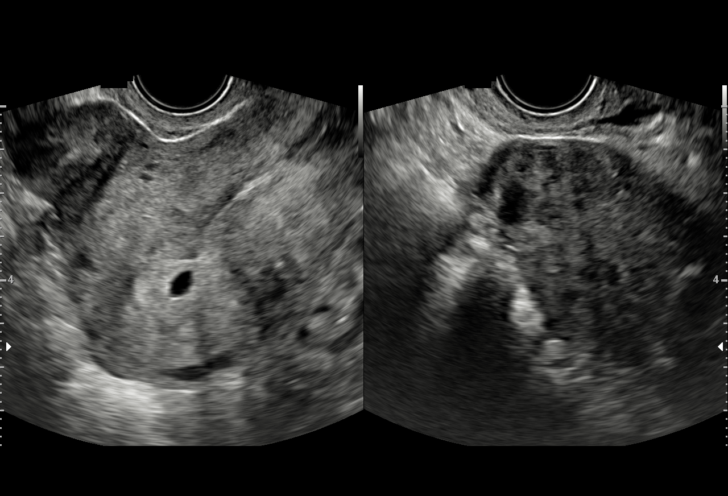
[im 72/93]
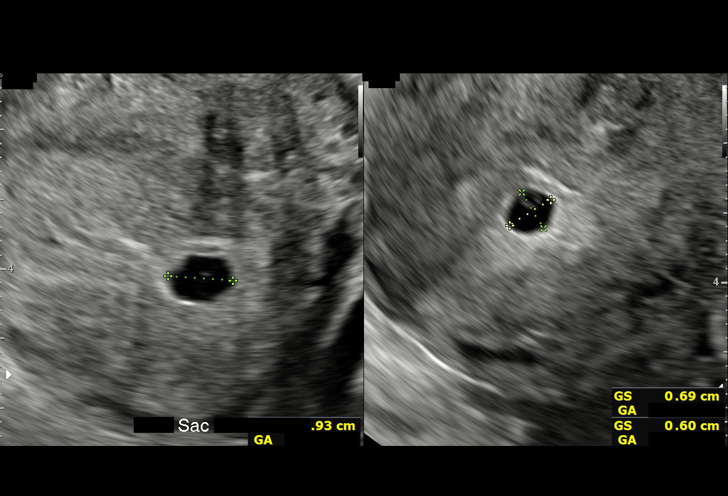
[im 79/93]
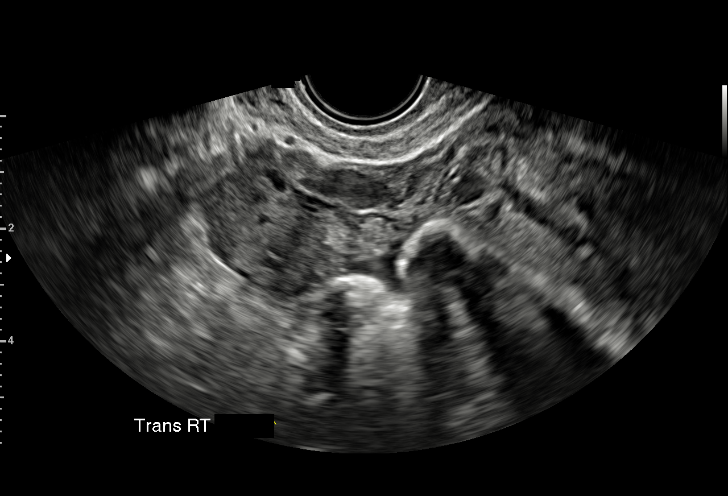
[im 86/93]
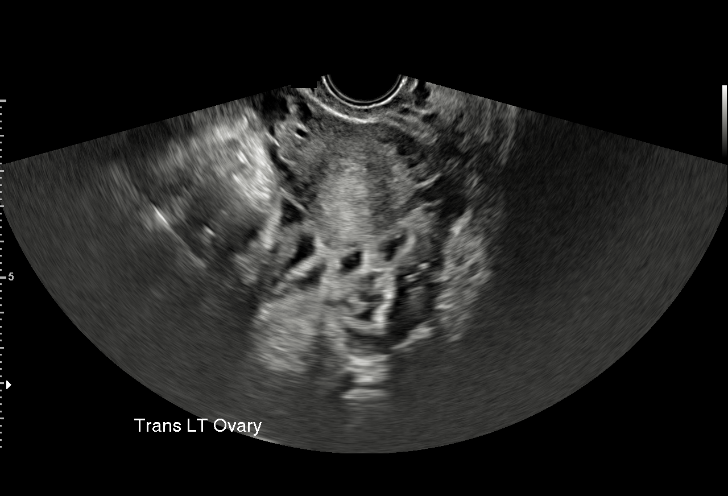
[im 93/93]
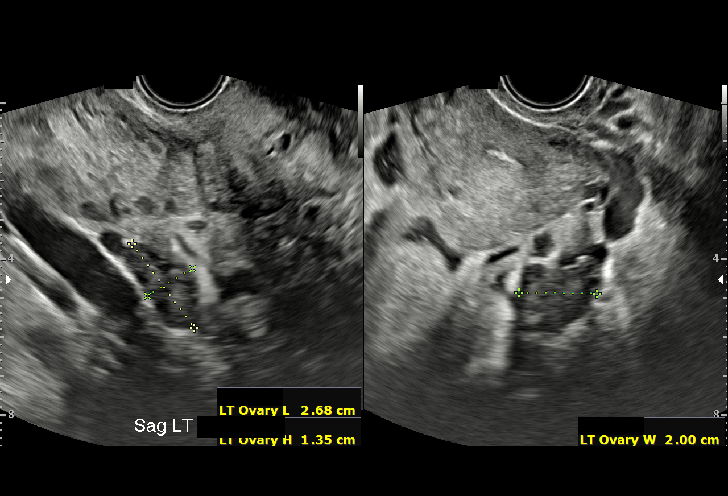

[15 of 28 positions shown; findings below may reference images not displayed]

FINDINGS: Intrauterine gestational sac: Single

Yolk sac:  Visualized.

Embryo:  Not Visualized.

MSD: 7 mm   5 w   3 d

Subchorionic hemorrhage:  None visualized.

Maternal uterus/adnexae: Several small uterine fibroids are seen,
largest measuring 3.5 cm. Both ovaries are normal appearance. No
adnexal mass or abnormal free fluid identified.
IMPRESSION: Single intrauterine gestational sac measuring 5 weeks 3 days by MSD.
Suggest correlation with serial b-hCG levels, and consider followup
ultrasound to assess viability in 10 days.

Several small uterine fibroids, largest measuring 3.5 cm.

## 2021-02-21 ENCOUNTER — Ambulatory Visit (HOSPITAL_COMMUNITY): Payer: 59 | Admitting: Physician Assistant

## 2021-02-26 NOTE — Progress Notes (Signed)
Primary Care Physician: Patient, No Pcp Per (Inactive) Primary Cardiologist: none Primary Electrophysiologist: none Referring Physician: Zac[facility] Pontes ER/Dr [facility]   Ashley Shaw is a 35 y.o. female with a history of anemia and new onset paroxysmal atrial fibrillation who presents for follow up in the [facility]. The patient was initially diagnosed with atrial fibrillation on 10/13/19 after presenting to [facility] ER with symptoms of heart racing, palpitations, SOB, and chest tightness. Initial ECG showed SVT with HR >200. Given adenosine which converted her to rate controlled afib. Started on metoprolol on discharge. Patient reports that in hindsight she has had brief palpitations intermittently for years but always assumed it was anxiety. She denies any significant alcohol use or snoring. There were no specific triggers she could identify. Of note, she did test positive for COVID-19 in 08/2019.   On follow up today, patient reports that she has done well since her last visit. She has had two episodes of heart racing in the interim. She took her PRN BB which resolved her symptoms. There were no specific triggers that she could identify.   Today, she denies symptoms of palpitations, chest pain, shortness of breath, orthopnea, PND, lower extremity edema, dizziness, presyncope, syncope, snoring, daytime somnolence, bleeding, or neurologic sequela. The patient is tolerating medications without difficulties and is otherwise without complaint today.    Atrial Fibrillation Risk Factors:  she does not have symptoms or diagnosis of sleep apnea. she does not have a history of rheumatic fever. she does not have a history of alcohol use. The patient does not have a history of early familial atrial fibrillation or other arrhythmias.  she has a BMI of Body mass index is 23.81 kg/m.Marland Kitchen Filed Weights   02/27/21 0843  Weight: 73.1 kg    No family history on  file.   Atrial Fibrillation Management history:  Previous antiarrhythmic drugs: none Previous cardioversions: none Previous ablations: none CHADS2VASC score: 1 Anticoagulation history: none   Past Medical History:  Diagnosis Date  . Acute urinary retention - immediate PP 04/13/2013  . Anemia   . Anemia of mother in pregnancy, delivered with postpartum condition 04/13/2013  . History of blood transfusion    Past Surgical History:  Procedure Laterality Date  . DILATION AND EVACUATION N/A 11/22/2015   Procedure: DILATATION AND EVACUATION;  Surgeon: Cheri Fowler, MD;  Location: [facility];  Service: Gynecology;  Laterality: N/A;  . LEEP  2009  . THERAPEUTIC ABORTION    . WISDOM TOOTH EXTRACTION  2006    Current Outpatient Medications  Medication Sig Dispense Refill  . metoprolol tartrate (LOPRESSOR) 25 MG tablet Take 1/2 by mouth every 6 hours AS NEEDED for palptations 15 tablet 0   No current facility-administered medications for this encounter.    No Known Allergies  Social History   Socioeconomic History  . Marital status: Single    Spouse name: Not on file  . Number of children: Not on file  . Years of education: Not on file  . Highest education level: Not on file  Occupational History  . Not on file  Tobacco Use  . Smoking status: Never Smoker  . Smokeless tobacco: Never Used  Vaping Use  . Vaping Use: Never used  Substance and Sexual Activity  . Alcohol use: Yes    Comment: occasionally  . Drug use: No  . Sexual activity: Yes    Birth control/protection: None  Other Topics Concern  . Not on file  Social  History Narrative  . Not on file   Social Determinants of Health   Financial Resource Strain: Not on file  Food Insecurity: Not on file  Transportation Needs: Not on file  Physical Activity: Not on file  Stress: Not on file  Social Connections: Not on file  Intimate Partner Violence: Not on file     ROS- All systems are reviewed and negative except  as per the HPI above.  Physical Exam: Vitals:   02/27/21 0843  BP: 118/62  Pulse: (!) 58  Weight: 73.1 kg  Height: 5\' 9"  (1.753 m)    GEN- The patient is a well appearing female, alert and oriented x 3 today.   HEENT-head normocephalic, atraumatic, sclera clear, conjunctiva pink, hearing intact, trachea midline. Lungs- Clear to ausculation bilaterally, normal work of breathing Heart- Regular rate and rhythm, no murmurs, rubs or gallops  GI- soft, NT, ND, + BS Extremities- no clubbing, cyanosis, or edema MS- no significant deformity or atrophy Skin- no rash or lesion Psych- euthymic mood, full affect Neuro- strength and sensation are intact   Wt Readings from Last 3 Encounters:  02/27/21 73.1 kg  04/27/20 71 kg  10/18/19 68.9 kg    EKG today demonstrates  SR Vent. rate 62 BPM PR interval 144 ms QRS duration 82 ms QT/QTcB 440/446 ms  Epic records are reviewed at length today  Assessment and Plan:  1. Paroxysmal atrial fibrillation/SVT Rare episodes that respond to PRN BB. Continue Lopressor 12.5-25 mg PRN q 6 hours for heart racing. No anticoagulation indicated at this point.  This patients CHA2DS2-VASc Score and unadjusted Ischemic Stroke Rate (% per year) is equal to 0.6 % stroke rate/year from a score of 1  Above score calculated as 1 point each if present [CHF, HTN, DM, Vascular=MI/PAD/Aortic Plaque, Age if 65-74, or Female] Above score calculated as 2 points each if present [Age > 75, or Stroke/TIA/TE]   Follow up in the AF clinic in one year.    [facility] [address] [place], [place] [address] [phone] 02/27/2021 9:00 AM

## 2021-02-27 ENCOUNTER — Ambulatory Visit (HOSPITAL_COMMUNITY)
Admission: RE | Admit: 2021-02-27 | Discharge: 2021-02-27 | Disposition: A | Discharge status: Home / Self Care | Payer: 59 | Source: Ambulatory Visit | Attending: Physician Assistant | Admitting: Physician Assistant

## 2021-02-27 ENCOUNTER — Other Ambulatory Visit: Payer: Self-pay

## 2021-02-27 ENCOUNTER — Encounter (HOSPITAL_COMMUNITY): Payer: Self-pay | Admitting: Physician Assistant

## 2021-02-27 VITALS — BP 118/62 | HR 58 | Ht 69.0 in | Wt 161.2 lb

## 2021-02-27 DIAGNOSIS — I480 Paroxysmal atrial fibrillation: Secondary | ICD-10-CM | POA: Insufficient documentation

## 2021-02-27 DIAGNOSIS — I471 Supraventricular tachycardia: Secondary | ICD-10-CM | POA: Insufficient documentation

## 2021-02-27 MED ORDER — METOPROLOL TARTRATE 25 MG PO TABS: ORAL_TABLET | ORAL | 1 refills | Status: AC
